# Patient Record
Sex: Female | Born: 1960 | Race: White | Hispanic: No | Marital: Married | Smoking: Former smoker
Health system: Southern US, Community
[De-identification: ages and names within clinical notes are randomized; demographics above are authoritative.]

## PROBLEM LIST (undated history)

## (undated) DIAGNOSIS — K219 Gastro-esophageal reflux disease without esophagitis: Secondary | ICD-10-CM

## (undated) DIAGNOSIS — F99 Mental disorder, not otherwise specified: Secondary | ICD-10-CM

## (undated) DIAGNOSIS — M5126 Other intervertebral disc displacement, lumbar region: Secondary | ICD-10-CM

## (undated) DIAGNOSIS — F32A Depression, unspecified: Secondary | ICD-10-CM

## (undated) DIAGNOSIS — F329 Major depressive disorder, single episode, unspecified: Secondary | ICD-10-CM

## (undated) DIAGNOSIS — I10 Essential (primary) hypertension: Secondary | ICD-10-CM

---

## 2011-12-20 ENCOUNTER — Other Ambulatory Visit: Payer: Self-pay | Admitting: Family Medicine

## 2011-12-20 DIAGNOSIS — Z1231 Encounter for screening mammogram for malignant neoplasm of breast: Secondary | ICD-10-CM

## 2012-01-27 ENCOUNTER — Ambulatory Visit
Admission: RE | Admit: 2012-01-27 | Discharge: 2012-01-27 | Disposition: A | Discharge status: Home / Self Care | Payer: BC Managed Care – PPO | Source: Ambulatory Visit | Attending: Family Medicine | Admitting: Family Medicine

## 2012-01-27 DIAGNOSIS — Z1231 Encounter for screening mammogram for malignant neoplasm of breast: Secondary | ICD-10-CM

## 2012-07-20 ENCOUNTER — Other Ambulatory Visit: Payer: Self-pay | Admitting: Family Medicine

## 2012-07-20 DIAGNOSIS — E041 Nontoxic single thyroid nodule: Secondary | ICD-10-CM

## 2012-07-21 ENCOUNTER — Ambulatory Visit
Admission: RE | Admit: 2012-07-21 | Discharge: 2012-07-21 | Disposition: A | Discharge status: Home / Self Care | Payer: BC Managed Care – PPO | Source: Ambulatory Visit | Attending: Family Medicine | Admitting: Family Medicine

## 2012-07-21 DIAGNOSIS — E041 Nontoxic single thyroid nodule: Secondary | ICD-10-CM

## 2012-07-22 ENCOUNTER — Other Ambulatory Visit: Payer: Self-pay | Admitting: Family Medicine

## 2012-07-22 DIAGNOSIS — E041 Nontoxic single thyroid nodule: Secondary | ICD-10-CM

## 2012-07-28 ENCOUNTER — Other Ambulatory Visit (HOSPITAL_COMMUNITY)
Admission: RE | Admit: 2012-07-28 | Discharge: 2012-07-28 | Disposition: A | Discharge status: Home / Self Care | Payer: BC Managed Care – PPO | Source: Ambulatory Visit | Attending: Diagnostic Radiology | Admitting: Diagnostic Radiology

## 2012-07-28 ENCOUNTER — Ambulatory Visit
Admission: RE | Admit: 2012-07-28 | Discharge: 2012-07-28 | Disposition: A | Discharge status: Home / Self Care | Payer: BC Managed Care – PPO | Source: Ambulatory Visit | Attending: Family Medicine | Admitting: Family Medicine

## 2012-07-28 DIAGNOSIS — E041 Nontoxic single thyroid nodule: Secondary | ICD-10-CM

## 2012-07-28 DIAGNOSIS — E049 Nontoxic goiter, unspecified: Secondary | ICD-10-CM | POA: Insufficient documentation

## 2013-01-11 ENCOUNTER — Other Ambulatory Visit: Payer: Self-pay | Admitting: Obstetrics and Gynecology

## 2013-01-12 ENCOUNTER — Encounter (HOSPITAL_COMMUNITY): Payer: Self-pay | Admitting: Pharmacist

## 2013-01-12 ENCOUNTER — Encounter (HOSPITAL_COMMUNITY): Payer: Self-pay | Admitting: *Deleted

## 2013-01-13 ENCOUNTER — Other Ambulatory Visit: Payer: Self-pay | Admitting: Family Medicine

## 2013-01-13 DIAGNOSIS — Z1231 Encounter for screening mammogram for malignant neoplasm of breast: Secondary | ICD-10-CM

## 2013-01-18 ENCOUNTER — Encounter (HOSPITAL_COMMUNITY)
Admission: RE | Disposition: A | Discharge status: Home / Self Care | Payer: Self-pay | Source: Ambulatory Visit | Attending: Obstetrics and Gynecology

## 2013-01-18 ENCOUNTER — Encounter (HOSPITAL_COMMUNITY): Payer: Self-pay | Admitting: Anesthesiology

## 2013-01-18 ENCOUNTER — Encounter (HOSPITAL_COMMUNITY): Payer: Self-pay | Admitting: *Deleted

## 2013-01-18 ENCOUNTER — Ambulatory Visit (HOSPITAL_COMMUNITY)
Admission: RE | Admit: 2013-01-18 | Discharge: 2013-01-18 | Disposition: A | Discharge status: Home / Self Care | Payer: BC Managed Care – PPO | Source: Ambulatory Visit | Attending: Obstetrics and Gynecology | Admitting: Obstetrics and Gynecology

## 2013-01-18 ENCOUNTER — Ambulatory Visit (HOSPITAL_COMMUNITY): Payer: BC Managed Care – PPO | Admitting: Anesthesiology

## 2013-01-18 DIAGNOSIS — N950 Postmenopausal bleeding: Secondary | ICD-10-CM | POA: Insufficient documentation

## 2013-01-18 DIAGNOSIS — N840 Polyp of corpus uteri: Secondary | ICD-10-CM | POA: Insufficient documentation

## 2013-01-18 HISTORY — DX: Gastro-esophageal reflux disease without esophagitis: K21.9

## 2013-01-18 HISTORY — DX: Mental disorder, not otherwise specified: F99

## 2013-01-18 HISTORY — PX: DILATATION & CURRETTAGE/HYSTEROSCOPY WITH RESECTOCOPE: SHX5572

## 2013-01-18 LAB — CBC
HCT: 37.9 % (ref 36.0–46.0)
Hemoglobin: 12.6 g/dL (ref 12.0–15.0)
MCH: 30.4 pg (ref 26.0–34.0)
MCHC: 33.2 g/dL (ref 30.0–36.0)
MCV: 91.5 fL (ref 78.0–100.0)
Platelets: 223 10*3/uL (ref 150–400)
RBC: 4.14 MIL/uL (ref 3.87–5.11)
RDW: 13 % (ref 11.5–15.5)
WBC: 4.9 10*3/uL (ref 4.0–10.5)

## 2013-01-18 SURGERY — DILATATION & CURETTAGE/HYSTEROSCOPY WITH RESECTOCOPE
Anesthesia: General | Wound class: Clean Contaminated

## 2013-01-18 MED ORDER — GLYCINE 1.5 % IR SOLN
Status: DC | PRN
  Administered 2013-01-18: 3000 mL

## 2013-01-18 MED ORDER — ONDANSETRON HCL 4 MG/2ML IJ SOLN
INTRAMUSCULAR | Status: AC
  Filled 2013-01-18: qty 2

## 2013-01-18 MED ORDER — MIDAZOLAM HCL 5 MG/5ML IJ SOLN
INTRAMUSCULAR | Status: DC | PRN
  Administered 2013-01-18: 2 mg via INTRAVENOUS

## 2013-01-18 MED ORDER — FENTANYL CITRATE 0.05 MG/ML IJ SOLN
INTRAMUSCULAR | Status: AC
  Filled 2013-01-18: qty 2

## 2013-01-18 MED ORDER — LIDOCAINE HCL (CARDIAC) 20 MG/ML IV SOLN
INTRAVENOUS | Status: DC | PRN
  Administered 2013-01-18 (×2): 30 mg via INTRAVENOUS

## 2013-01-18 MED ORDER — KETOROLAC TROMETHAMINE 30 MG/ML IJ SOLN
INTRAMUSCULAR | Status: DC | PRN
  Administered 2013-01-18 (×2): 30 mg via INTRAVENOUS

## 2013-01-18 MED ORDER — PROPOFOL 10 MG/ML IV EMUL
INTRAVENOUS | Status: AC
  Filled 2013-01-18: qty 20

## 2013-01-18 MED ORDER — PROPOFOL 10 MG/ML IV EMUL
INTRAVENOUS | Status: DC | PRN
  Administered 2013-01-18: 180 mg via INTRAVENOUS

## 2013-01-18 MED ORDER — FENTANYL CITRATE 0.05 MG/ML IJ SOLN
INTRAMUSCULAR | Status: DC | PRN
  Administered 2013-01-18 (×2): 50 ug via INTRAVENOUS

## 2013-01-18 MED ORDER — FENTANYL CITRATE 0.05 MG/ML IJ SOLN
25.0000 ug | INTRAMUSCULAR | Status: DC | PRN
  Administered 2013-01-18 (×2): 50 ug via INTRAVENOUS

## 2013-01-18 MED ORDER — KETOROLAC TROMETHAMINE 30 MG/ML IJ SOLN
INTRAMUSCULAR | Status: AC
  Filled 2013-01-18: qty 1

## 2013-01-18 MED ORDER — LACTATED RINGERS IV SOLN
INTRAVENOUS | Status: DC
Start: 2013-01-18 — End: 2013-01-18
  Administered 2013-01-18 (×2): via INTRAVENOUS

## 2013-01-18 MED ORDER — CHLOROPROCAINE HCL 1 % IJ SOLN
INTRAMUSCULAR | Status: AC
  Filled 2013-01-18: qty 30

## 2013-01-18 MED ORDER — MIDAZOLAM HCL 2 MG/2ML IJ SOLN
INTRAMUSCULAR | Status: AC
  Filled 2013-01-18: qty 2

## 2013-01-18 MED ORDER — FLUMAZENIL 1 MG/10ML IV SOLN
INTRAVENOUS | Status: AC
  Filled 2013-01-18: qty 20

## 2013-01-18 MED ORDER — ONDANSETRON HCL 4 MG/2ML IJ SOLN
INTRAMUSCULAR | Status: DC | PRN
  Administered 2013-01-18: 4 mg via INTRAVENOUS

## 2013-01-18 MED ORDER — CHLOROPROCAINE HCL 1 % IJ SOLN
INTRAMUSCULAR | Status: DC | PRN
  Administered 2013-01-18: 20 mL

## 2013-01-18 MED ORDER — FENTANYL CITRATE 0.05 MG/ML IJ SOLN
INTRAMUSCULAR | Status: AC
  Administered 2013-01-18: 50 ug via INTRAVENOUS
  Filled 2013-01-18: qty 2

## 2013-01-18 MED ORDER — LIDOCAINE HCL (CARDIAC) 20 MG/ML IV SOLN
INTRAVENOUS | Status: AC
  Filled 2013-01-18: qty 5

## 2013-01-18 SURGICAL SUPPLY — 18 items
CANISTER SUCTION 2500CC (MISCELLANEOUS) ×2 IMPLANT
CATH ROBINSON RED A/P 16FR (CATHETERS) ×2 IMPLANT
CLOTH BEACON ORANGE TIMEOUT ST (SAFETY) ×2 IMPLANT
CONTAINER PREFILL 10% NBF 60ML (FORM) ×4 IMPLANT
DRESSING TELFA 8X3 (GAUZE/BANDAGES/DRESSINGS) ×2 IMPLANT
ELECT REM PT RETURN 9FT ADLT (ELECTROSURGICAL) ×2
ELECTRODE REM PT RTRN 9FT ADLT (ELECTROSURGICAL) ×1 IMPLANT
ELECTRODE ROLLER VERSAPOINT (ELECTRODE) IMPLANT
ELECTRODE RT ANGLE VERSAPOINT (CUTTING LOOP) IMPLANT
GLOVE BIO SURGEON STRL SZ 6.5 (GLOVE) ×2 IMPLANT
GLOVE BIOGEL PI IND STRL 7.0 (GLOVE) ×2 IMPLANT
GLOVE BIOGEL PI INDICATOR 7.0 (GLOVE) ×2
GOWN STRL REIN XL XLG (GOWN DISPOSABLE) ×4 IMPLANT
LOOP ANGLED CUTTING 22FR (CUTTING LOOP) IMPLANT
PACK HYSTEROSCOPY LF (CUSTOM PROCEDURE TRAY) ×2 IMPLANT
PAD OB MATERNITY 4.3X12.25 (PERSONAL CARE ITEMS) ×2 IMPLANT
TOWEL OR 17X24 6PK STRL BLUE (TOWEL DISPOSABLE) ×4 IMPLANT
WATER STERILE IRR 1000ML POUR (IV SOLUTION) ×2 IMPLANT

## 2013-01-18 NOTE — Anesthesia Preprocedure Evaluation (Signed)

## 2013-01-18 NOTE — Brief Op Note (Signed)
01/18/2013  1:46 PM  PATIENT:  Jarome Lamas  52 y.o. female  PRE-OPERATIVE DIAGNOSIS:  Postmenopausal Bleeding;  Endometrial Mass    POST-OPERATIVE DIAGNOSIS:  Postmenopausal Bleeding;  Endometrial polyp  PROCEDURE:  Diagnostic hysteroscopy, hysteroscopic resection of endometrial polyp, dilation and currettage  SURGEON:  Surgeon(s) and Role:    * Keylee Shrestha A Darice Vicario, MD - Primary  PHYSICIAN ASSISTANT:   ASSISTANTS: none   ANESTHESIA:   general and paracervical block Findings: right lateral endom polyp, nl tubal ostia, thin endometrium nl endocervical canal  EBL:  Total I/O In: 1200 [I.V.:1200] Out: 30 [Urine:30]  BLOOD ADMINISTERED:none  DRAINS: none   LOCAL MEDICATIONS USED:  OTHER 1% nesicaine  SPECIMEN:  Source of Specimen:  EMC w/ endometrial polyp  DISPOSITION OF SPECIMEN:  PATHOLOGY  COUNTS:  YES  TOURNIQUET:  * No tourniquets in log *  DICTATION: .Other Dictation: Dictation Number [id number]  PLAN OF CARE: Discharge to home after PACU  PATIENT DISPOSITION:  PACU - hemodynamically stable.   Delay start of Pharmacological VTE agent (>24hrs) due to surgical blood loss or risk of bleeding: no

## 2013-01-18 NOTE — Transfer of Care (Signed)
Immediate Anesthesia Transfer of Care Note  Patient: Melissa Dudley  Procedure(s) Performed: Procedure(s) (LRB) with comments: DILATATION & CURETTAGE/HYSTEROSCOPY WITH RESECTOCOPE (N/A)  Patient Location: PACU  Anesthesia Type:General  Level of Consciousness: awake, oriented and patient cooperative  Airway & Oxygen Therapy: Patient Spontanous Breathing and Patient connected to nasal cannula oxygen  Post-op Assessment: Report given to PACU RN and Post -op Vital signs reviewed and stable  Post vital signs: Reviewed and stable  Complications: No apparent anesthesia complications

## 2013-01-19 ENCOUNTER — Encounter (HOSPITAL_COMMUNITY): Payer: Self-pay | Admitting: Obstetrics and Gynecology

## 2013-01-19 NOTE — Anesthesia Postprocedure Evaluation (Signed)
  Anesthesia Post-op Note  Patient: Melissa Dudley  Procedure(s) Performed: Procedure(s) (LRB) with comments: DILATATION & CURETTAGE/HYSTEROSCOPY WITH RESECTOCOPE (N/A) Patient is awake and responsive. Pain and nausea are reasonably well controlled. Vital signs are stable and clinically acceptable. Oxygen saturation is clinically acceptable. There are no apparent anesthetic complications at this time. Patient is ready for discharge.

## 2013-01-19 NOTE — Op Note (Signed)
NAMELING, FLESCH NO.:  [id number]  MEDICAL RECORD NO.:  [id number]  LOCATION:  [facility]                          FACILITY:  [facility]  PHYSICIAN:  Maxie Better, M.D.DATE OF BIRTH:  03/18/1961  DATE OF PROCEDURE:  01/18/2013 DATE OF DISCHARGE:  01/18/2013                              OPERATIVE REPORT   PREOPERATIVE DIAGNOSES:  Postmenopausal bleeding, endometrial mass.  PROCEDURES:  Diagnostic hysteroscopy, hysteroscopic resection of endometrial polyp, and dilation and curettage.  POSTOPERATIVE DIAGNOSES:  Postmenopausal bleeding, endometrial polyp.  ANESTHESIA:  General, paracervical block.  SURGEON:  Maxie Better, M.D.  ASSISTANT:  None.  DESCRIPTION OF PROCEDURE:  Under adequate general anesthesia, the patient was placed in the dorsal lithotomy position.  She was sterilely prepped and draped in usual fashion.  Bladder was catheterized for moderate amount of urine.  Examination under anesthesia revealed anteverted uterus.  No adnexal masses could be appreciated.  A bivalve speculum was placed in the vagina.  A 20 mL of 1% Nesacaine was injected paracervically at the 3 and 9 o'clock position.  The anterior lip of the cervix was grasped with a single-tooth tenaculum.  The cervix was serially dilated up to 29 Pratt dilator.  A glycine prime, single loop resectoscope was introduced into the uterine cavity.  The left tubal ostia could be seen.  The right was obscured by the large endometrial polyp arising from the right lateral wall.  Using the resectoscope, the polyp was removed in 2 pieces.  The underlying stalk was also resected. The right tubal ostia could then be seen.  The resectoscope was removed. The cavity was curetted for scant amount of tissue.  The resectoscope was reinserted.  No additional lesions were noted.  The endocervical canal was inspected.  It was without any lesions.  At that point, the procedure was felt to be completed and all  instruments were then removed from the vagina.  Specimen was endometrial curetting with endometrial polyps sent to Pathology.  Estimated blood losswas minimal. Intraoperative fluid was 1200 mL.  Urine output was about 30 mL.  Sponge and instrument counts x2 was correct.  Complications were none.  The patient tolerated the procedure well and was transferred to recovery room in stable condition.     Maxie Better, M.D.     [place]/MEDQ  D:  01/18/2013  T:  01/19/2013  Job:  [id number]

## 2013-02-15 ENCOUNTER — Ambulatory Visit
Admission: RE | Admit: 2013-02-15 | Discharge: 2013-02-15 | Disposition: A | Discharge status: Home / Self Care | Payer: BC Managed Care – PPO | Source: Ambulatory Visit | Attending: Family Medicine | Admitting: Family Medicine

## 2013-02-15 DIAGNOSIS — Z1231 Encounter for screening mammogram for malignant neoplasm of breast: Secondary | ICD-10-CM

## 2013-11-27 ENCOUNTER — Emergency Department (HOSPITAL_COMMUNITY)
Admission: EM | Admit: 2013-11-27 | Discharge: 2013-11-27 | Disposition: A | Discharge status: Home / Self Care | Payer: BC Managed Care – PPO | Source: Home / Self Care | Attending: Family Medicine | Admitting: Family Medicine

## 2013-11-27 ENCOUNTER — Emergency Department (INDEPENDENT_AMBULATORY_CARE_PROVIDER_SITE_OTHER): Payer: BC Managed Care – PPO

## 2013-11-27 ENCOUNTER — Encounter (HOSPITAL_COMMUNITY): Payer: Self-pay | Admitting: Emergency Medicine

## 2013-11-27 DIAGNOSIS — S63509A Unspecified sprain of unspecified wrist, initial encounter: Secondary | ICD-10-CM

## 2013-11-27 DIAGNOSIS — S63502A Unspecified sprain of left wrist, initial encounter: Secondary | ICD-10-CM

## 2013-11-27 NOTE — ED Provider Notes (Signed)
CSN: [id number]     Arrival date & time 11/27/13  1839 History   None    Chief Complaint  Patient presents with  . Wrist Injury   (Consider location/radiation/quality/duration/timing/severity/associated sxs/prior Treatment) Patient is a 52 y.o. female presenting with wrist injury. The history is provided by the patient.  Wrist Injury Location:  Wrist Time since incident:  7 hours Injury: yes   Mechanism of injury: fall   Fall:    Fall occurred: playing basketball with gr cildren and fell backward onto left wrist,, elbow and shoulder intact, distal nvt intact.   Impact surface:  Concrete   Point of impact:  Outstretched arms   Entrapped after fall: no   Wrist location:  L wrist   Past Medical History  Diagnosis Date  . Mental disorder   . GERD (gastroesophageal reflux disease)     stress related   Past Surgical History  Procedure Laterality Date  . Cesarean section    . Dilatation & currettage/hysteroscopy with resectocope  01/18/2013    Procedure: DILATATION & CURETTAGE/HYSTEROSCOPY WITH RESECTOCOPE;  Surgeon: Serita Kyle, MD;  Location: [facility];  Service: Gynecology;  Laterality: N/A;   No family history on file. History  Substance Use Topics  . Smoking status: Former Smoker    Types: Cigarettes    Quit date: 01/12/2006  . Smokeless tobacco: Never Used  . Alcohol Use: No   OB History   Grav Para Term Preterm Abortions TAB SAB Ect Mult Living                 Review of Systems  Constitutional: Negative.   Musculoskeletal: Negative for joint swelling.  Skin: Negative.     Allergies  Review of patient's allergies indicates no known allergies.  Home Medications   Current Outpatient Rx  Name  Route  Sig  Dispense  Refill  . acetaminophen (TYLENOL) 500 MG tablet   Oral   Take 500 mg by mouth daily as needed. For headaches when not taking Ibuprofen         . buPROPion (WELLBUTRIN SR) 150 MG 12 hr tablet   Oral   Take 150-300 mg by mouth 2 (two)  times daily. 300mg  in the am and 150mg  at bedtime         . citalopram (CELEXA) 10 MG tablet   Oral   Take 10 mg by mouth at bedtime.         . clobetasol ointment (TEMOVATE) 0.05 %   Topical   Apply 1 application topically daily as needed. For eczema itching         . ibuprofen (ADVIL,MOTRIN) 600 MG tablet   Oral   Take 600 mg by mouth 3 (three) times daily as needed. For headaches          BP 138/85  Pulse 67  Temp(Src) 98.4 F (36.9 C) (Oral)  Resp 18  SpO2 98%  LMP 01/25/2012 Physical Exam  Nursing note and vitals reviewed. Constitutional: She is oriented to person, place, and time. She appears well-developed and well-nourished.  Musculoskeletal: She exhibits tenderness.       Left shoulder: Normal.       Left elbow: Normal.       Left wrist: She exhibits tenderness. She exhibits normal range of motion, no bony tenderness, no swelling, no crepitus and no deformity.       Left hand: Normal.  Neurological: She is alert and oriented to person, place, and time.  Skin: Skin is  warm and dry.    ED Course  Procedures (including critical care time) Labs Review Labs Reviewed - No data to display Imaging Review Dg Wrist Complete Left  11/27/2013   CLINICAL DATA:  Fall.  Wrist injury.  Radial sided wrist pain.  EXAM: LEFT WRIST - COMPLETE 3+ VIEW  COMPARISON:  None.  FINDINGS: There is no evidence of fracture or dislocation. There is no evidence of arthropathy or other focal bone abnormality. Soft tissues are unremarkable.  IMPRESSION: Negative.   Electronically Signed   By: Myles Rosenthal M.D.   On: 11/27/2013 19:35    EKG Interpretation    Date/Time:    Ventricular Rate:    PR Interval:    QRS Duration:   QT Interval:    QTC Calculation:   R Axis:     Text Interpretation:              MDM  X-rays reviewed and report per radiologist.     Linna Hoff, MD 11/27/13 [phone]

## 2013-11-27 NOTE — ED Notes (Signed)
Pt c/o left wrist inj onset 1200 today while playing basketball... Fell on concrete backwards Sxs also include: tingly of hands She is alert w/no signs of acute distress.

## 2014-01-17 ENCOUNTER — Other Ambulatory Visit: Payer: Self-pay

## 2014-01-17 DIAGNOSIS — Z1231 Encounter for screening mammogram for malignant neoplasm of breast: Secondary | ICD-10-CM

## 2014-03-16 ENCOUNTER — Ambulatory Visit
Admission: RE | Admit: 2014-03-16 | Discharge: 2014-03-16 | Disposition: A | Discharge status: Home / Self Care | Payer: BC Managed Care – PPO | Source: Ambulatory Visit

## 2014-03-16 DIAGNOSIS — Z1231 Encounter for screening mammogram for malignant neoplasm of breast: Secondary | ICD-10-CM

## 2015-02-23 ENCOUNTER — Other Ambulatory Visit: Payer: Self-pay

## 2015-02-23 DIAGNOSIS — Z1231 Encounter for screening mammogram for malignant neoplasm of breast: Secondary | ICD-10-CM

## 2015-03-24 ENCOUNTER — Ambulatory Visit
Admission: RE | Admit: 2015-03-24 | Discharge: 2015-03-24 | Disposition: A | Discharge status: Home / Self Care | Payer: BC Managed Care – PPO | Source: Ambulatory Visit

## 2015-03-24 DIAGNOSIS — Z1231 Encounter for screening mammogram for malignant neoplasm of breast: Secondary | ICD-10-CM

## 2016-03-18 ENCOUNTER — Other Ambulatory Visit: Payer: Self-pay

## 2016-03-18 DIAGNOSIS — Z1231 Encounter for screening mammogram for malignant neoplasm of breast: Secondary | ICD-10-CM

## 2016-04-22 ENCOUNTER — Ambulatory Visit
Admission: RE | Admit: 2016-04-22 | Discharge: 2016-04-22 | Disposition: A | Discharge status: Home / Self Care | Payer: BC Managed Care – PPO | Source: Ambulatory Visit

## 2016-04-22 DIAGNOSIS — Z1231 Encounter for screening mammogram for malignant neoplasm of breast: Secondary | ICD-10-CM

## 2016-08-07 ENCOUNTER — Encounter (HOSPITAL_COMMUNITY): Payer: Self-pay | Admitting: Emergency Medicine

## 2016-08-07 ENCOUNTER — Emergency Department (HOSPITAL_COMMUNITY)
Admission: EM | Admit: 2016-08-07 | Discharge: 2016-08-07 | Disposition: A | Discharge status: Home / Self Care | Payer: No Typology Code available for payment source | Attending: Emergency Medicine | Admitting: Emergency Medicine

## 2016-08-07 ENCOUNTER — Emergency Department (HOSPITAL_COMMUNITY): Payer: No Typology Code available for payment source

## 2016-08-07 DIAGNOSIS — Z87891 Personal history of nicotine dependence: Secondary | ICD-10-CM | POA: Insufficient documentation

## 2016-08-07 DIAGNOSIS — Y92410 Unspecified street and highway as the place of occurrence of the external cause: Secondary | ICD-10-CM | POA: Insufficient documentation

## 2016-08-07 DIAGNOSIS — Y999 Unspecified external cause status: Secondary | ICD-10-CM | POA: Insufficient documentation

## 2016-08-07 DIAGNOSIS — M549 Dorsalgia, unspecified: Secondary | ICD-10-CM | POA: Insufficient documentation

## 2016-08-07 DIAGNOSIS — S0990XA Unspecified injury of head, initial encounter: Secondary | ICD-10-CM | POA: Diagnosis present

## 2016-08-07 DIAGNOSIS — S161XXA Strain of muscle, fascia and tendon at neck level, initial encounter: Secondary | ICD-10-CM | POA: Insufficient documentation

## 2016-08-07 DIAGNOSIS — Z79899 Other long term (current) drug therapy: Secondary | ICD-10-CM | POA: Insufficient documentation

## 2016-08-07 DIAGNOSIS — Y9389 Activity, other specified: Secondary | ICD-10-CM | POA: Diagnosis not present

## 2016-08-07 DIAGNOSIS — Z791 Long term (current) use of non-steroidal anti-inflammatories (NSAID): Secondary | ICD-10-CM | POA: Diagnosis not present

## 2016-08-07 MED ORDER — CYCLOBENZAPRINE HCL 5 MG PO TABS: 5.0000 mg | ORAL_TABLET | Freq: Two times a day (BID) | ORAL | 0 refills | Status: DC | PRN
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
## 2017-03-24 ENCOUNTER — Other Ambulatory Visit: Payer: Self-pay | Admitting: Plastic Surgery

## 2017-03-24 DIAGNOSIS — N62 Hypertrophy of breast: Secondary | ICD-10-CM

## 2017-03-27 ENCOUNTER — Other Ambulatory Visit: Payer: Self-pay | Admitting: Plastic Surgery

## 2017-03-27 DIAGNOSIS — N62 Hypertrophy of breast: Secondary | ICD-10-CM

## 2017-03-27 DIAGNOSIS — Z01818 Encounter for other preprocedural examination: Secondary | ICD-10-CM

## 2017-03-28 ENCOUNTER — Ambulatory Visit
Admission: RE | Admit: 2017-03-28 | Discharge: 2017-03-28 | Disposition: A | Discharge status: Home / Self Care | Payer: BC Managed Care – PPO | Source: Ambulatory Visit | Attending: Plastic Surgery | Admitting: Plastic Surgery

## 2017-03-28 DIAGNOSIS — N62 Hypertrophy of breast: Secondary | ICD-10-CM

## 2017-03-28 DIAGNOSIS — Z01818 Encounter for other preprocedural examination: Secondary | ICD-10-CM

## 2017-04-15 ENCOUNTER — Other Ambulatory Visit: Payer: Self-pay | Admitting: Plastic Surgery

## 2017-04-15 HISTORY — PX: REDUCTION MAMMAPLASTY: SUR839

## 2018-05-15 ENCOUNTER — Other Ambulatory Visit: Payer: Self-pay | Admitting: Plastic Surgery

## 2018-05-15 DIAGNOSIS — Z1231 Encounter for screening mammogram for malignant neoplasm of breast: Secondary | ICD-10-CM

## 2018-07-21 ENCOUNTER — Ambulatory Visit
Admission: RE | Admit: 2018-07-21 | Discharge: 2018-07-21 | Disposition: A | Discharge status: Home / Self Care | Payer: BC Managed Care – PPO | Source: Ambulatory Visit | Attending: Plastic Surgery | Admitting: Plastic Surgery

## 2018-07-21 DIAGNOSIS — Z1231 Encounter for screening mammogram for malignant neoplasm of breast: Secondary | ICD-10-CM

## 2018-07-24 ENCOUNTER — Ambulatory Visit: Payer: BC Managed Care – PPO

## 2018-09-15 ENCOUNTER — Emergency Department (HOSPITAL_COMMUNITY): Payer: BC Managed Care – PPO

## 2018-09-15 ENCOUNTER — Emergency Department (HOSPITAL_COMMUNITY)
Admission: EM | Admit: 2018-09-15 | Discharge: 2018-09-15 | Disposition: A | Discharge status: Home / Self Care | Payer: BC Managed Care – PPO | Attending: Emergency Medicine | Admitting: Emergency Medicine

## 2018-09-15 ENCOUNTER — Encounter (HOSPITAL_COMMUNITY): Payer: Self-pay | Admitting: Emergency Medicine

## 2018-09-15 ENCOUNTER — Other Ambulatory Visit: Payer: Self-pay

## 2018-09-15 DIAGNOSIS — Z87891 Personal history of nicotine dependence: Secondary | ICD-10-CM | POA: Insufficient documentation

## 2018-09-15 DIAGNOSIS — I10 Essential (primary) hypertension: Secondary | ICD-10-CM | POA: Insufficient documentation

## 2018-09-15 DIAGNOSIS — Z79899 Other long term (current) drug therapy: Secondary | ICD-10-CM | POA: Diagnosis not present

## 2018-09-15 DIAGNOSIS — R002 Palpitations: Secondary | ICD-10-CM | POA: Diagnosis not present

## 2018-09-15 DIAGNOSIS — R42 Dizziness and giddiness: Secondary | ICD-10-CM | POA: Diagnosis present

## 2018-09-15 HISTORY — DX: Major depressive disorder, single episode, unspecified: F32.9

## 2018-09-15 HISTORY — DX: Other intervertebral disc displacement, lumbar region: M51.26

## 2018-09-15 HISTORY — DX: Essential (primary) hypertension: I10

## 2018-09-15 HISTORY — DX: Depression, unspecified: F32.A

## 2018-09-15 LAB — BASIC METABOLIC PANEL
Anion gap: 8 (ref 5–15)
BUN: 11 mg/dL (ref 6–20)
CO2: 27 mmol/L (ref 22–32)
Calcium: 9.3 mg/dL (ref 8.9–10.3)
Chloride: 105 mmol/L (ref 98–111)
Creatinine, Ser: 0.83 mg/dL (ref 0.44–1.00)
GFR calc Af Amer: >60 mL/min (ref >60)
GFR calc non Af Amer: >60 mL/min (ref >60)
Glucose, Bld: 102 mg/dL — ABNORMAL HIGH (ref 70–99)
Potassium: 4.2 mmol/L (ref 3.5–5.1)
Sodium: 140 mmol/L (ref 135–145)

## 2018-09-15 LAB — CBC
HCT: 37.8 % (ref 36.0–46.0)
Hemoglobin: 12.2 g/dL (ref 12.0–15.0)
MCH: 31.5 pg (ref 26.0–34.0)
MCHC: 32.3 g/dL (ref 30.0–36.0)
MCV: 97.7 fL (ref 78.0–100.0)
Platelets: 215 10*3/uL (ref 150–400)
RBC: 3.87 MIL/uL (ref 3.87–5.11)
RDW: 12.3 % (ref 11.5–15.5)
WBC: 3.7 10*3/uL — ABNORMAL LOW (ref 4.0–10.5)

## 2018-09-15 LAB — I-STAT TROPONIN, ED: Troponin i, poc: 0 ng/mL (ref 0.00–0.08)

## 2018-09-15 MED ORDER — LISINOPRIL 10 MG PO TABS: 10.0000 mg | ORAL_TABLET | Freq: Every day | ORAL | 1 refills | Status: DC

## 2018-09-15 NOTE — ED Notes (Signed)
Pt returned from CT °

## 2018-09-15 NOTE — ED Triage Notes (Signed)
Patient reports feeling very lethargic for a few days today she woke up with blurry vision and felt that her heart was fluttering. Pt states she has been checking her BP at home and it has been constantly elevated.

## 2018-09-15 NOTE — Discharge Instructions (Addendum)
Follow up with Dr. Andrey Campanile for evaluation of blood pressure.  I will restart your lisinopril.  Return if any problems.

## 2018-09-15 NOTE — ED Notes (Signed)
Patient verbalizes understanding of discharge instructions. Opportunity for questioning and answers were provided. Ambulatory at discharge in NAD.  

## 2018-09-15 NOTE — ED Provider Notes (Signed)
[facility] EMERGENCY DEPARTMENT Provider Note   CSN: [id number] Arrival date & time: 09/15/18  1038     History   Chief Complaint Chief Complaint  Patient presents with  . Hypertension  . Dizziness    HPI Melissa Dudley is a 57 y.o. female.  The history is provided by the patient. No language interpreter was used.  Hypertension  This is a new problem. The current episode started more than 1 week ago. The problem occurs constantly. The problem has been gradually worsening. Associated symptoms comments: palpatations. Nothing aggravates the symptoms. Nothing relieves the symptoms. She has tried nothing for the symptoms. The treatment provided no relief.  Dizziness  Pt reports she has been having episodes of blurred vision on and off for over 2 months.  Pt reports episodes come and go.  Pt reports she has been checking her blood pressure and her blood pressure has been elevated.  Pt reports she has been on blood pressure medications in the past.  Pt reports she lost weight and Dr. Andrey Campanile stopped her BP medication.    Past Medical History:  Diagnosis Date  . Depression   . GERD (gastroesophageal reflux disease)    stress related  . Hypertension   . Lumbar herniated disc   . Mental disorder     There are no active problems to display for this patient.   Past Surgical History:  Procedure Laterality Date  . CESAREAN SECTION    . DILATATION & CURRETTAGE/HYSTEROSCOPY WITH RESECTOCOPE  01/18/2013   Procedure: DILATATION & CURETTAGE/HYSTEROSCOPY WITH RESECTOCOPE;  Surgeon: Serita Kyle, MD;  Location: [facility];  Service: Gynecology;  Laterality: N/A;  . REDUCTION MAMMAPLASTY Bilateral 04/15/2017     OB History   None      Home Medications    Prior to Admission medications   Medication Sig Start Date End Date Taking? Authorizing Provider  acetaminophen (TYLENOL) 500 MG tablet Take 500 mg by mouth daily as needed. For headaches when not taking  Ibuprofen   Yes [provider]  buPROPion (WELLBUTRIN SR) 150 MG 12 hr tablet Take 150 mg by mouth daily.    Yes [provider]  citalopram (CELEXA) 10 MG tablet Take 10 mg by mouth daily.    Yes [provider]  clobetasol ointment (TEMOVATE) 0.05 % Apply 1 application topically daily as needed. For eczema itching   Yes [provider]  fexofenadine (ALLEGRA) 180 MG tablet Take 180 mg by mouth daily as needed for allergies.    Yes [provider]  fluticasone (FLONASE) 50 MCG/ACT nasal spray Place 1-2 sprays into the nose daily as needed for allergies. 04/01/16  Yes [provider]  ibuprofen (ADVIL,MOTRIN) 600 MG tablet Take 600 mg by mouth 3 (three) times daily as needed. For headaches   Yes [provider]  Melatonin 10 MG TABS Take 10 mg by mouth at bedtime.   Yes [provider]  cyclobenzaprine (FLEXERIL) 5 MG tablet Take 1 tablet (5 mg total) by mouth 2 (two) times daily as needed for muscle spasms. Patient not taking: Reported on 09/15/2018 08/07/16   Benjiman Core, MD    Family History No family history on file.  Social History Social History   Tobacco Use  . Smoking status: Former Smoker    Types: Cigarettes    Last attempt to quit: 01/12/2006    Years since quitting: 12.6  . Smokeless tobacco: Never Used  Substance Use Topics  . Alcohol use:  No  . Drug use: No     Allergies   Bee venom and Hydrocodone   Review of Systems Review of Systems  Eyes: Positive for visual disturbance.  Neurological: Positive for dizziness.  All other systems reviewed and are negative.    Physical Exam Updated Vital Signs BP (!) 149/96   Pulse (!) 55   Temp 98.6 F (37 C) (Oral)   Resp 15   Ht 5\' 4"  (1.626 m)   Wt 73.9 kg   LMP 01/25/2012   SpO2 100%   BMI 27.98 kg/m   Physical Exam  Constitutional: She appears well-developed and well-nourished.  HENT:  Head: Normocephalic.  Right Ear: External  ear normal.  Left Ear: External ear normal.  Nose: Nose normal.  Mouth/Throat: Oropharynx is clear and moist.  Eyes: Pupils are equal, round, and reactive to light. Conjunctivae and EOM are normal.  Neck: Normal range of motion. Neck supple.  Cardiovascular: Normal rate.  Pulmonary/Chest: Effort normal.  Abdominal: Soft.  Musculoskeletal: Normal range of motion.  Neurological: She is alert.  Skin: Skin is warm.  Psychiatric: She has a normal mood and affect.  Nursing note and vitals reviewed.    ED Treatments / Results  Labs (all labs ordered are listed, but only abnormal results are displayed) Labs Reviewed  BASIC METABOLIC PANEL - Abnormal; Notable for the following components:      Result Value   Glucose, Bld 102 (*)    All other components within normal limits  CBC - Abnormal; Notable for the following components:   WBC 3.7 (*)    All other components within normal limits  URINALYSIS, ROUTINE W REFLEX MICROSCOPIC  I-STAT TROPONIN, ED  CBG MONITORING, ED    EKG EKG Interpretation  Date/Time:  Tuesday September 15 2018 11:15:23 EDT Ventricular Rate:  61 PR Interval:  168 QRS Duration: 92 QT Interval:  418 QTC Calculation: 420 R Axis:   51 Text Interpretation:  Normal sinus rhythm Normal ECG Confirmed by Virgina Norfolk [phone]) on 09/15/2018 4:34:55 PM   Radiology Ct Head Wo Contrast  Result Date: 09/15/2018 CLINICAL DATA:  Lethargic, elevated BP EXAM: CT HEAD WITHOUT CONTRAST TECHNIQUE: Contiguous axial images were obtained from the base of the skull through the vertex without intravenous contrast. COMPARISON:  CT 08/07/2016 FINDINGS: Brain: No evidence of acute infarction, hemorrhage, hydrocephalus, extra-axial collection or mass lesion/mass effect. Vascular: No hyperdense vessel or unexpected calcification. Skull: Normal. Negative for fracture or focal lesion. Sinuses/Orbits: No acute finding. Other: None IMPRESSION: Negative non contrasted CT appearance of the brain.  Electronically Signed   By: Jasmine Pang M.D.   On: 09/15/2018 15:32    Procedures Procedures (including critical care time)  Medications Ordered in ED Medications - No data to display   Initial Impression / Assessment and Plan / ED Course  I have reviewed the triage vital signs and the nursing notes.  Pertinent labs & imaging results that were available during my care of the patient were reviewed by me and considered in my medical decision making (see chart for details).     MDM  Ct head is normal.   EKg  Normal sinus normal EKG.   Troponin negative.   Pt given rx for lisinopril which she was on in the past    Final Clinical Impressions(s) / ED Diagnoses   Final diagnoses:  Hypertension, unspecified type  Palpitations    ED Discharge Orders         Ordered    lisinopril (  PRINIVIL,ZESTRIL) 10 MG tablet  Daily     09/15/18 1638        An After Visit Summary was printed and given to the patient.    Elson Areas, PA-C 09/15/18 1639    Virgina Norfolk, DO 09/15/18 2331

## 2019-10-21 ENCOUNTER — Other Ambulatory Visit: Payer: Self-pay | Admitting: Family Medicine

## 2019-10-21 DIAGNOSIS — Z1231 Encounter for screening mammogram for malignant neoplasm of breast: Secondary | ICD-10-CM

## 2019-12-14 ENCOUNTER — Ambulatory Visit
Admission: RE | Admit: 2019-12-14 | Discharge: 2019-12-14 | Disposition: A | Discharge status: Home / Self Care | Payer: BC Managed Care – PPO | Source: Ambulatory Visit | Attending: Family Medicine | Admitting: Family Medicine

## 2019-12-14 ENCOUNTER — Other Ambulatory Visit: Payer: Self-pay

## 2019-12-14 DIAGNOSIS — Z1231 Encounter for screening mammogram for malignant neoplasm of breast: Secondary | ICD-10-CM

## 2020-11-30 ENCOUNTER — Other Ambulatory Visit: Payer: Self-pay | Admitting: Family Medicine

## 2020-11-30 DIAGNOSIS — Z1231 Encounter for screening mammogram for malignant neoplasm of breast: Secondary | ICD-10-CM

## 2021-01-12 ENCOUNTER — Other Ambulatory Visit: Payer: Self-pay

## 2021-01-12 ENCOUNTER — Ambulatory Visit
Admission: RE | Admit: 2021-01-12 | Discharge: 2021-01-12 | Disposition: A | Discharge status: Home / Self Care | Payer: BC Managed Care – PPO | Source: Ambulatory Visit | Attending: Family Medicine | Admitting: Family Medicine

## 2021-01-12 DIAGNOSIS — Z1231 Encounter for screening mammogram for malignant neoplasm of breast: Secondary | ICD-10-CM

## 2021-12-14 ENCOUNTER — Encounter (HOSPITAL_COMMUNITY): Payer: Self-pay | Admitting: Emergency Medicine

## 2021-12-14 ENCOUNTER — Emergency Department (HOSPITAL_COMMUNITY)
Admission: EM | Admit: 2021-12-14 | Discharge: 2021-12-14 | Disposition: A | Discharge status: Home / Self Care | Payer: BC Managed Care – PPO | Attending: Emergency Medicine | Admitting: Emergency Medicine

## 2021-12-14 ENCOUNTER — Other Ambulatory Visit: Payer: Self-pay

## 2021-12-14 ENCOUNTER — Emergency Department (HOSPITAL_COMMUNITY): Payer: BC Managed Care – PPO

## 2021-12-14 DIAGNOSIS — R202 Paresthesia of skin: Secondary | ICD-10-CM | POA: Insufficient documentation

## 2021-12-14 DIAGNOSIS — M5416 Radiculopathy, lumbar region: Secondary | ICD-10-CM | POA: Insufficient documentation

## 2021-12-14 DIAGNOSIS — S0502XA Injury of conjunctiva and corneal abrasion without foreign body, left eye, initial encounter: Secondary | ICD-10-CM | POA: Diagnosis not present

## 2021-12-14 DIAGNOSIS — X58XXXA Exposure to other specified factors, initial encounter: Secondary | ICD-10-CM | POA: Diagnosis not present

## 2021-12-14 DIAGNOSIS — Z79899 Other long term (current) drug therapy: Secondary | ICD-10-CM | POA: Diagnosis not present

## 2021-12-14 DIAGNOSIS — I10 Essential (primary) hypertension: Secondary | ICD-10-CM | POA: Diagnosis not present

## 2021-12-14 DIAGNOSIS — Z87891 Personal history of nicotine dependence: Secondary | ICD-10-CM | POA: Insufficient documentation

## 2021-12-14 DIAGNOSIS — S0592XA Unspecified injury of left eye and orbit, initial encounter: Secondary | ICD-10-CM | POA: Diagnosis present

## 2021-12-14 MED ORDER — LORAZEPAM 0.5 MG PO TABS
0.5000 mg | ORAL_TABLET | Freq: Once | ORAL | Status: AC
  Administered 2021-12-14: 12:00:00 0.5 mg via ORAL
  Filled 2021-12-14: qty 1

## 2021-12-14 MED ORDER — HYDROCODONE-ACETAMINOPHEN 5-325 MG PO TABS: 1.0000 | ORAL_TABLET | Freq: Four times a day (QID) | ORAL | 0 refills | Status: DC | PRN

## 2021-12-14 MED ORDER — FLUORESCEIN SODIUM 1 MG OP STRP
1.0000 | ORAL_STRIP | Freq: Once | OPHTHALMIC | Status: DC
  Filled 2021-12-14: qty 1

## 2021-12-14 MED ORDER — DIPHENHYDRAMINE HCL 25 MG PO CAPS
25.0000 mg | ORAL_CAPSULE | Freq: Once | ORAL | Status: AC
  Administered 2021-12-14: 12:00:00 25 mg via ORAL
  Filled 2021-12-14: qty 1

## 2021-12-14 MED ORDER — GADOBUTROL 1 MMOL/ML IV SOLN
7.7000 mL | Freq: Once | INTRAVENOUS | Status: AC | PRN
  Administered 2021-12-14: 14:00:00 7.7 mL via INTRAVENOUS

## 2021-12-14 MED ORDER — TETRACAINE HCL 0.5 % OP SOLN
2.0000 [drp] | Freq: Once | OPHTHALMIC | Status: DC
  Filled 2021-12-14: qty 4

## 2021-12-14 MED ORDER — HYDROCODONE-ACETAMINOPHEN 5-325 MG PO TABS
1.0000 | ORAL_TABLET | Freq: Once | ORAL | Status: AC
  Administered 2021-12-14: 12:00:00 1 via ORAL
  Filled 2021-12-14: qty 1

## 2021-12-14 MED ORDER — HYDROCODONE-ACETAMINOPHEN 5-325 MG PO TABS
1.0000 | ORAL_TABLET | Freq: Once | ORAL | Status: AC
  Administered 2021-12-14: 16:00:00 1 via ORAL
  Filled 2021-12-14: qty 1

## 2021-12-14 NOTE — ED Notes (Signed)
Called pt to recheck vitals. No response.  

## 2021-12-14 NOTE — ED Triage Notes (Signed)
Pt reports hx of hernia disks worsening over the last week. Pt seen last night at [facility] given pain medications IM, started steroid prescription today. Denies recent fevers, bowel bladder incontinence. Pt unable to walk due to pain 10/10.

## 2021-12-14 NOTE — ED Notes (Signed)
Pt walked w/assistx1 to the bathroom

## 2021-12-14 NOTE — ED Notes (Signed)
Pt states she gets numbness and tingling in her lower back that shoots into her hips and down her left leg. Pt denies loss of bowel and bladder.

## 2021-12-14 NOTE — ED Notes (Signed)
Pt in MRI.

## 2021-12-14 NOTE — ED Provider Notes (Signed)
[facility] EMERGENCY DEPARTMENT Provider Note   CSN: [id number] Arrival date & time: 12/14/21  1105     History Chief Complaint  Patient presents with   Back Pain    Melissa Dudley is a 60 y.o. female who presents with back pain.  The history is provided by the patient.  Back Pain Location:  Lumbar spine Quality:  Shooting and stabbing Radiates to:  L thigh Pain severity:  Severe Pain is:  Same all the time Onset quality:  Sudden Duration:  2 days Timing:  Constant Progression:  Unchanged Chronicity: acute on chronic. Context: not falling, not jumping from heights, not lifting heavy objects, not MCA, not MVA, not occupational injury, not physical stress, not recent injury and not twisting   Relieved by:  Narcotics Ineffective treatments:  Ibuprofen, NSAIDs, muscle relaxants, being still and bed rest Associated symptoms: numbness (L foot) and tingling (L foot)   Associated symptoms: no abdominal pain, no bladder incontinence, no chest pain, no dysuria, no fever, no headaches and no weakness       Past Medical History:  Diagnosis Date   Depression    GERD (gastroesophageal reflux disease)    stress related   Hypertension    Lumbar herniated disc    Mental disorder     There are no problems to display for this patient.   Past Surgical History:  Procedure Laterality Date   CESAREAN SECTION     DILATATION & CURRETTAGE/HYSTEROSCOPY WITH RESECTOCOPE  01/18/2013   Procedure: DILATATION & CURETTAGE/HYSTEROSCOPY WITH RESECTOCOPE;  Surgeon: Serita Kyle, MD;  Location: [facility];  Service: Gynecology;  Laterality: N/A;   REDUCTION MAMMAPLASTY Bilateral 04/15/2017     OB History   No obstetric history on file.     History reviewed. No pertinent family history.  Social History   Tobacco Use   Smoking status: Former    Types: Cigarettes    Quit date: 01/12/2006    Years since quitting: 15.9   Smokeless tobacco: Never  Substance Use  Topics   Alcohol use: No   Drug use: No    Home Medications Prior to Admission medications   Medication Sig Start Date End Date Taking? Authorizing Provider  buPROPion (WELLBUTRIN XL) 300 MG 24 hr tablet Take 300 mg by mouth in the morning.   Yes [provider]  citalopram (CELEXA) 40 MG tablet Take 40 mg by mouth in the morning.   Yes [provider]  fluticasone (FLONASE) 50 MCG/ACT nasal spray Place 1-2 sprays into both nostrils daily as needed for allergies or rhinitis. 04/01/16  Yes [provider]  HYDROcodone-acetaminophen (NORCO) 5-325 MG tablet Take 1 tablet by mouth every 6 (six) hours as needed for moderate pain. 12/14/21  Yes Keith Cancio, Davy Pique, MD  ibuprofen (ADVIL,MOTRIN) 600 MG tablet Take 600 mg by mouth 3 (three) times daily as needed for headache or mild pain.   Yes [provider]  lisinopril (PRINIVIL,ZESTRIL) 10 MG tablet Take 1 tablet (10 mg total) by mouth daily. 09/15/18  Yes Cheron Schaumann K, PA-C  predniSONE (DELTASONE) 10 MG tablet Take 20-60 mg by mouth See admin instructions. Take 60 mg by mouth once daily on days 1-4, 40 mg once daily on days 5-8, and 20 mg once daily on days 9-12   Yes [provider]  trimethoprim-polymyxin b (POLYTRIM) ophthalmic solution Place 1 drop into the left eye 4 (four) times daily. 12/13/21  Yes [provider]  clobetasol ointment (TEMOVATE) 0.05 %  Apply 1 application topically daily as needed. For eczema itching    [provider]  cyclobenzaprine (FLEXERIL) 5 MG tablet Take 1 tablet (5 mg total) by mouth 2 (two) times daily as needed for muscle spasms. Patient not taking: Reported on 12/14/2021 08/07/16   Davonna Belling, MD  fexofenadine (ALLEGRA) 180 MG tablet Take 180 mg by mouth daily as needed for allergies.     [provider]  Melatonin 10 MG TABS Take 10 mg by mouth at bedtime.    [provider]    Allergies    Bee venom and Oxycodone  hcl  Review of Systems   Review of Systems  Constitutional:  Negative for chills and fever.  HENT:  Negative for ear pain and sore throat.   Eyes:  Positive for redness and visual disturbance (blurry vision).  Respiratory:  Negative for cough and shortness of breath.   Cardiovascular:  Negative for chest pain and palpitations.  Gastrointestinal:  Negative for abdominal pain and vomiting.  Genitourinary:  Negative for bladder incontinence, dysuria and hematuria.  Musculoskeletal:  Positive for back pain. Negative for arthralgias.  Skin:  Negative for color change and rash.  Neurological:  Positive for tingling (L foot) and numbness (L foot). Negative for seizures, syncope, weakness and headaches.  All other systems reviewed and are negative.  Physical Exam Updated Vital Signs BP (!) 154/95    Pulse 66    Temp 98.8 F (37.1 C) (Oral)    Resp 18    Ht 5\' 4"  (1.626 m)    Wt 77.1 kg    LMP 01/25/2012    SpO2 96%    BMI 29.18 kg/m   Physical Exam Vitals and nursing note reviewed.  Constitutional:      General: She is not in acute distress.    Appearance: She is well-developed.  HENT:     Head: Normocephalic and atraumatic.     Right Ear: External ear normal.     Left Ear: External ear normal.     Nose: Nose normal.     Mouth/Throat:     Mouth: Mucous membranes are moist.     Pharynx: Oropharynx is clear.  Eyes:     Extraocular Movements: Extraocular movements intact.     Conjunctiva/sclera:     Left eye: Left conjunctiva is injected.     Pupils: Pupils are equal, round, and reactive to light.     Left eye: Corneal abrasion and fluorescein uptake present.   Cardiovascular:     Rate and Rhythm: Normal rate and regular rhythm.     Pulses: Normal pulses.     Heart sounds: Normal heart sounds. No murmur heard. Pulmonary:     Effort: Pulmonary effort is normal. No respiratory distress.     Breath sounds: Normal breath sounds.  Abdominal:     General: There is no distension.      Palpations: Abdomen is soft.     Tenderness: There is no abdominal tenderness. There is no guarding or rebound.  Musculoskeletal:        General: No swelling.     Cervical back: Neck supple.  Skin:    General: Skin is warm and dry.     Capillary Refill: Capillary refill takes less than 2 seconds.  Neurological:     General: No focal deficit present.     Mental Status: She is alert and oriented to person, place, and time.     Motor: No weakness.     Comments:  Intermittent paresthesias in L foot  Psychiatric:        Mood and Affect: Mood normal.    ED Results / Procedures / Treatments   Labs (all labs ordered are listed, but only abnormal results are displayed) Labs Reviewed - No data to display  EKG None  Radiology MR Lumbar Spine W Wo Contrast  Result Date: 12/14/2021 CLINICAL DATA:  Persistent low back pain EXAM: MRI LUMBAR SPINE WITHOUT AND WITH CONTRAST TECHNIQUE: Multiplanar and multiecho pulse sequences of the lumbar spine were obtained without and with intravenous contrast. CONTRAST:  7.71mL GADAVIST GADOBUTROL 1 MMOL/ML IV SOLN COMPARISON:  Lumbar spine MRI 05/29/2020 FINDINGS: Segmentation: Standard; the lowest formed disc space is designated L5-S1 Alignment:  Normal. 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 Vertebral body heights are preserved. There is no suspicious marrow signal abnormality. There is no abnormal marrow enhancement. Conus medullaris and cauda equina: Conus extends to the mid L1 level. Conus and cauda equina appear normal. Paraspinal and other soft tissues: Unremarkable. Disc levels: There is disc desiccation and narrowing at L5-S1. The other disc spaces are overall preserved. There is mild facet arthropathy at L3-L4 through L5-S1. T12-L1: No significant spinal canal or neural foraminal stenosis L1-L2: No significant spinal canal or neural foraminal stenosis. L2-L3: There  is a mild disc bulge. A T2 intermediate signal structure is noted within the left subarticular zone extending to the neural foramen which was not present on the prior study and may reflect extruded or sequestered disc material. A left foraminal annular fissure is also noted, new since 2021. This results in narrowing of the left subarticular zone with possible displacement of the traversing left L3 nerve root and severe left neural foraminal stenosis with possible impingement of the exiting L2 nerve root. There is no significant spinal canal or right neural foraminal stenosis. L3-L4: There is a minimal disc bulge with a left foraminal annular fissure and mild bilateral facet arthropathy resulting in mild left worse than right neural foraminal stenosis without significant spinal canal stenosis, not significantly changed L4-L5: There is a mild disc bulge with a left foraminal/extraforaminal annular fissure and mild bilateral facet arthropathy resulting in mild-to-moderate right worse than left neural foraminal stenosis without significant spinal canal stenosis, not significantly changed. L5-S1: There is a mild disc bulge with a left foraminal component and bilateral facet arthropathy resulting in severe left and moderate right neural foraminal stenosis without significant spinal canal stenosis, not significantly changed. IMPRESSION: 1. Suspected left subarticular zone/foraminal disc extrusion or sequestration at L2-L3 which may displace the traversing left L3 nerve root and/or impinge the exiting left L2 nerve root, new since the prior study from 2021. Correlate with any radicular symptoms. 2. 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 3. Additional multilevel degenerative changes detailed above without other high-grade spinal canal or neural foraminal stenosis are overall not significantly changed. 4. Mild facet arthropathy at L3-L4 through L5-S1. Electronically Signed   By: Valetta Mole M.D.   On: 12/14/2021 14:22    Procedures Procedures   Medications Ordered in ED Medications  HYDROcodone-acetaminophen (NORCO/VICODIN) 5-325 MG per tablet 1 tablet (1 tablet Oral Given 12/14/21 1152)  diphenhydrAMINE (BENADRYL) capsule 25 mg (25 mg Oral Given 12/14/21 1152)  LORazepam (ATIVAN) tablet 0.5 mg (0.5 mg Oral Given 12/14/21 1152)  gadobutrol (GADAVIST) 1 MMOL/ML injection 7.7 mL (7.7 mLs Intravenous Contrast Given 12/14/21 1350)  HYDROcodone-acetaminophen (NORCO/VICODIN) 5-325 MG per  tablet 1 tablet (1 tablet Oral Given 12/14/21 1618)    ED Course  I have reviewed the triage vital signs and the nursing notes.  Pertinent labs & imaging results that were available during my care of the patient were reviewed by me and considered in my medical decision making (see chart for details).    MDM Rules/Calculators/A&P                          Patient presents with acute on chronic radicular back pain as described in HPI above.  She denies red flag symptoms such as saddle anesthesia or urinary incontinence.  She denies any new trauma to the area.  Her pain is not improved with OTC pain medications, muscle relaxers, or steroid injections received at urgent care last night.  MRI reveals left L2-L3 nerve root impingement which is new compared to prior MRI from 2021.  Imaging findings correlate with patient's new left radicular symptoms.  No evidence of cauda equina syndrome on MRI.  Patient states that the [place] she was given earlier provided relief of her pain.  We will plan to prescribe short course of Norco until patient can follow-up with her spine surgeon.  Recommended continued multimodal treatment with OTC pain medications, muscle relaxers, prednisone course as previously prescribed.  I instructed her to only use Norco in case of breakthrough pain.  During evaluation, the patient incidentally mentioned her left eye has been bothering her.  It is injected on exam. She was  reportedly prescribed antibiotic drops at urgent care the other night.  On fluorescein staining, the patient does appear to have a small corneal abrasion on the medial aspect of the patient's left cornea.  Given that she has already been prescribed antibiotic drops, no further treatment is indicated.  Discharge instruction and return precautions were discussed with the patient prior to discharge and included in the AVS.  She voiced understanding of these instructions and was amenable with the plan as described.  Patient was then discharged in stable condition.  Final Clinical Impression(s) / ED Diagnoses Final diagnoses:  Lumbar radiculopathy    Rx / DC Orders ED Discharge Orders          Ordered    HYDROcodone-acetaminophen (NORCO) 5-325 MG tablet  Every 6 hours PRN        12/14/21 1840             Jeslie Lowe, Amalia Hailey, MD 12/15/21 1105    Sherwood Gambler, MD 12/16/21 1506

## 2021-12-14 NOTE — ED Provider Notes (Signed)
Emergency Medicine Provider Triage Evaluation Note  Melissa Dudley , a 60 y.o. female  was evaluated in triage.  Pt complains of lower back pain.  Patient has history of slipped disc, went to [facility] urgent care last night and received 2 steroid injections in her spine.  Chills prescribed steroids and muscle relaxer, and states that none of this has helped her.  Review of Systems  Positive: Back pain, intermittent numbness in left leg Negative: Fever, chills, saddle anesthesia, urinary retention or incontinence  Physical Exam  LMP 01/25/2012  Gen:   Awake, no distress   Resp:  Normal effort  MSK:   Moves extremities without difficulty  Other:  Difficulty walking due to pain  Medical Decision Making  Medically screening exam initiated at 11:14 AM.  Appropriate orders placed.  Jarome Lamas was informed that the remainder of the evaluation will be completed by another provider, this initial triage assessment does not replace that evaluation, and the importance of remaining in the ED until their evaluation is complete.     Jeanella Flattery 12/14/21 1139    Ernie Avena, MD 12/14/21 1209

## 2022-01-16 ENCOUNTER — Other Ambulatory Visit: Payer: Self-pay | Admitting: Family Medicine

## 2022-01-16 DIAGNOSIS — Z1231 Encounter for screening mammogram for malignant neoplasm of breast: Secondary | ICD-10-CM

## 2022-01-21 ENCOUNTER — Other Ambulatory Visit: Payer: Self-pay

## 2022-01-21 ENCOUNTER — Ambulatory Visit
Admission: RE | Admit: 2022-01-21 | Discharge: 2022-01-21 | Disposition: A | Discharge status: Home / Self Care | Payer: BC Managed Care – PPO | Source: Ambulatory Visit | Attending: Family Medicine | Admitting: Family Medicine

## 2022-01-21 DIAGNOSIS — Z1231 Encounter for screening mammogram for malignant neoplasm of breast: Secondary | ICD-10-CM

## 2022-04-11 NOTE — Progress Notes (Signed)
?Cardiology Office Note:   ? ?Date:  04/12/2022  ? ?ID:  Melissa Dudley, DOB Dec 18, 1960, MRN [id number] ? ?PCP:  Sigmund Hazel, MD  ?Cardiologist:  None  ?Electrophysiologist:  None  ? ?Referring MD: Barbie Banner, MD  ? ?Chief Complaint  ?Patient presents with  ? Chest Pain  ? ? ?History of Present Illness:   ? ?Melissa Dudley is a 61 y.o. female with a hx of GERD, hypertension, hyperlipidemia, alcohol abuse who is referred by Dr. Andrey Campanile for evaluation of palpitations.  She reports she has been having fluttering in her chest, occurring almost daily.  Last for about 5 minutes.  Also reports has been having chest tightness about once per week.  Describes as tightness in center of her chest, lasting for couple minutes.  She walks 15 to 20 minutes 3-4 times per week, has not noted chest pain with exertion.  However does report she has been having dyspnea with exertion.  Reports she feels short of breath with walking up 1 flight of stairs.  She denies any lightheadedness, syncope, or lower extremity edema.  Smoked 0.5 packs/day for less than 10 years, quit in 2003.  Drinks 1 glass of wine per day.  Drinks 4 cups per coffee per day and 1 Diet Coke.  Family history includes maternal grandfather had CVA in 18s. ? ? ? ? ?Past Medical History:  ?Diagnosis Date  ? Depression   ? GERD (gastroesophageal reflux disease)   ? stress related  ? Hypertension   ? Lumbar herniated disc   ? Mental disorder   ? ? ?Past Surgical History:  ?Procedure Laterality Date  ? CESAREAN SECTION    ? DILATATION & CURRETTAGE/HYSTEROSCOPY WITH RESECTOCOPE  01/18/2013  ? Procedure: DILATATION & CURETTAGE/HYSTEROSCOPY WITH RESECTOCOPE;  Surgeon: Serita Kyle, MD;  Location: [facility];  Service: Gynecology;  Laterality: N/A;  ? REDUCTION MAMMAPLASTY Bilateral 04/15/2017  ? ? ?Current Medications: ?Current Meds  ?Medication Sig  ? buPROPion (WELLBUTRIN XL) 300 MG 24 hr tablet Take 300 mg by mouth in the morning.  ? citalopram (CELEXA) 40 MG tablet Take  40 mg by mouth in the morning.  ? clobetasol ointment (TEMOVATE) 0.05 % Apply 1 application topically daily as needed. For eczema itching  ? fexofenadine (ALLEGRA) 180 MG tablet Take 180 mg by mouth daily as needed for allergies.   ? fluticasone (FLONASE) 50 MCG/ACT nasal spray Place 1-2 sprays into both nostrils daily as needed for allergies or rhinitis.  ? ibuprofen (ADVIL,MOTRIN) 600 MG tablet Take 600 mg by mouth 3 (three) times daily as needed for headache or mild pain.  ? lisinopril (PRINIVIL,ZESTRIL) 10 MG tablet Take 1 tablet (10 mg total) by mouth daily.  ? lisinopril-hydrochlorothiazide (ZESTORETIC) 20-25 MG tablet Take 1 tablet by mouth daily.  ? Melatonin 10 MG TABS Take 10 mg by mouth at bedtime.  ? metoprolol tartrate (LOPRESSOR) 25 MG tablet Take 1 tablet (25 mg) TWO hours prior to CT scan  ?  ? ?Allergies:   Bee venom and Oxycodone hcl  ? ?Social History  ? ?Socioeconomic History  ? Marital status: Married  ?  Spouse name: Not on file  ? Number of children: Not on file  ? Years of education: Not on file  ? Highest education level: Not on file  ?Occupational History  ? Not on file  ?Tobacco Use  ? Smoking status: Former  ?  Types: Cigarettes  ?  Quit date: 01/12/2006  ?  Years since quitting:  16.2  ? Smokeless tobacco: Never  ?Substance and Sexual Activity  ? Alcohol use: No  ? Drug use: No  ? Sexual activity: Not on file  ?Other Topics Concern  ? Not on file  ?Social History Narrative  ? Not on file  ? ?Social Determinants of Health  ? ?Financial Resource Strain: Not on file  ?Food Insecurity: Not on file  ?Transportation Needs: Not on file  ?Physical Activity: Not on file  ?Stress: Not on file  ?Social Connections: Not on file  ?  ? ?Family History: ? Family history includes maternal grandfather had CVA in 5170s. ? ?ROS:   ?Please see the history of present illness.    ? All other systems reviewed and are negative. ? ?EKGs/Labs/Other Studies Reviewed:   ? ?The following studies were reviewed  today: ? ? ?EKG:   ?04/12/22: NSR, rate 62, nonspecific T wave flattening ? ?Recent Labs: ?No results found for requested labs within last 8760 hours.  ?Recent Lipid Panel ?No results found for: CHOL, TRIG, HDL, CHOLHDL, VLDL, LDLCALC, LDLDIRECT ? ?Physical Exam:   ? ?VS:  BP 130/76   Pulse 62   Ht 5\' 3"  (1.6 m)   Wt 175 lb 6.4 oz (79.6 kg)   LMP 01/25/2012   SpO2 97%   BMI 31.07 kg/m?    ? ?Wt Readings from Last 3 Encounters:  ?04/12/22 175 lb 6.4 oz (79.6 kg)  ?12/14/21 170 lb (77.1 kg)  ?09/15/18 163 lb (73.9 kg)  ?  ? ?GEN:  Well nourished, well developed in no acute distress ?HEENT: Normal ?NECK: No JVD; No carotid bruits ?LYMPHATICS: No lymphadenopathy ?CARDIAC: RRR, no murmurs, rubs, gallops ?RESPIRATORY:  Clear to auscultation without rales, wheezing or rhonchi  ?ABDOMEN: Soft, non-tender, non-distended ?MUSCULOSKELETAL:  No edema; No deformity  ?SKIN: Warm and dry ?NEUROLOGIC:  Alert and oriented x 3 ?PSYCHIATRIC:  Normal affect  ? ?ASSESSMENT:   ? ?1. Chest pain of uncertain etiology   ?2. DOE (dyspnea on exertion)   ?3. Palpitations   ?4. Essential hypertension   ?5. Hyperlipidemia, unspecified hyperlipidemia type   ? ?PLAN:   ? ?Chest pain/DOE: Atypical in description but does have significant CAD risk factors (age, hypertension, hyperlipidemia, former tobacco use).  Also having dyspnea with exertion that could represent anginal equivalent. ?-Recommend coronary CTA to rule out obstructive CAD.  Will give Lopressor 25 mg prior to study ?-Echocardiogram to evaluate for structural heart disease ? ?Palpitations: Description concerning for arrhythmia, will evaluate with Zio patch x7 days ? ?Hypertension: On lisinopril-HCTZ 20-25 mg daily.  Appears controlled.  Check BMET ? ?Hyperlipidemia: LDL 166 on 03/11/2022.  10-year ASCVD risk or 5%, not meeting indication for statin at this time.  Will follow-up results of coronary CTA for further risk stratification as above ? ?RTC in 6 months ? ?Medication  Adjustments/Labs and Tests Ordered: ?Current medicines are reviewed at length with the patient today.  Concerns regarding medicines are outlined above.  ?Orders Placed This Encounter  ?Procedures  ? CT CORONARY MORPH W/CTA COR W/SCORE W/CA W/CM &/OR WO/CM  ? Basic metabolic panel  ? Magnesium  ? LONG TERM MONITOR (3-14 DAYS)  ? EKG 12-Lead  ? ECHOCARDIOGRAM COMPLETE  ? ?Meds ordered this encounter  ?Medications  ? metoprolol tartrate (LOPRESSOR) 25 MG tablet  ?  Sig: Take 1 tablet (25 mg) TWO hours prior to CT scan  ?  Dispense:  1 tablet  ?  Refill:  0  ? ? ?Patient Instructions  ?Medication Instructions:  ?  Your physician recommends that you continue on your current medications as directed. Please refer to the Current Medication list given to you today. ? ?*If you need a refill on your cardiac medications before your next appointment, please call your pharmacy* ? ? ?Lab Work: ?BMET, [place] today ? ?If you have labs (blood work) drawn today and your tests are completely normal, you will receive your results only by: ?MyChart Message (if you have MyChart) OR ?A paper copy in the mail ?If you have any lab test that is abnormal or we need to change your treatment, we will call you to review the results. ? ? ?Testing/Procedures: ?Coronary CTA-see instructions below ? ?Your physician has requested that you have an echocardiogram. Echocardiography is a painless test that uses sound waves to create images of your heart. It provides your doctor with information about the size and shape of your heart and how well your heart?s chambers and valves are working. This procedure takes approximately one hour. There are no restrictions for this procedure. ? ?ZIO XT- Long Term Monitor Instructions  ? ?Your physician has requested you wear a ZIO patch monitor for _7_ days.  ?This is a single patch monitor.   [employer] supplies one patch monitor per enrollment. Additional stickers are not available. Please do not apply patch if you will be  having a Nuclear Stress Test, Echocardiogram, Cardiac CT, MRI, or Chest Xray during the period you would be wearing the monitor. The patch cannot be worn during these tests. You cannot remove and re-apply the ZIO XT patch m

## 2022-04-12 ENCOUNTER — Encounter: Payer: Self-pay | Admitting: Cardiology

## 2022-04-12 ENCOUNTER — Ambulatory Visit: Payer: BC Managed Care – PPO | Admitting: Cardiology

## 2022-04-12 ENCOUNTER — Ambulatory Visit (INDEPENDENT_AMBULATORY_CARE_PROVIDER_SITE_OTHER): Payer: BC Managed Care – PPO

## 2022-04-12 VITALS — BP 130/76 | HR 62 | Ht 63.0 in | Wt 175.4 lb

## 2022-04-12 DIAGNOSIS — R002 Palpitations: Secondary | ICD-10-CM | POA: Diagnosis not present

## 2022-04-12 DIAGNOSIS — R079 Chest pain, unspecified: Secondary | ICD-10-CM

## 2022-04-12 DIAGNOSIS — E785 Hyperlipidemia, unspecified: Secondary | ICD-10-CM

## 2022-04-12 DIAGNOSIS — I10 Essential (primary) hypertension: Secondary | ICD-10-CM

## 2022-04-12 DIAGNOSIS — R0609 Other forms of dyspnea: Secondary | ICD-10-CM

## 2022-04-12 MED ORDER — METOPROLOL TARTRATE 25 MG PO TABS: ORAL_TABLET | ORAL | 0 refills | Status: DC

## 2022-04-12 NOTE — Patient Instructions (Addendum)
Medication Instructions:  ?Your physician recommends that you continue on your current medications as directed. Please refer to the Current Medication list given to you today. ? ?*If you need a refill on your cardiac medications before your next appointment, please call your pharmacy* ? ? ?Lab Work: ?BMET, [place] today ? ?If you have labs (blood work) drawn today and your tests are completely normal, you will receive your results only by: ?MyChart Message (if you have MyChart) OR ?A paper copy in the mail ?If you have any lab test that is abnormal or we need to change your treatment, we will call you to review the results. ? ? ?Testing/Procedures: ?Coronary CTA-see instructions below ? ?Your physician has requested that you have an echocardiogram. Echocardiography is a painless test that uses sound waves to create images of your heart. It provides your doctor with information about the size and shape of your heart and how well your heart?s chambers and valves are working. This procedure takes approximately one hour. There are no restrictions for this procedure. ? ?ZIO XT- Long Term Monitor Instructions  ? ?Your physician has requested you wear a ZIO patch monitor for _7_ days.  ?This is a single patch monitor.   [employer] supplies one patch monitor per enrollment. Additional stickers are not available. Please do not apply patch if you will be having a Nuclear Stress Test, Echocardiogram, Cardiac CT, MRI, or Chest Xray during the period you would be wearing the monitor. The patch cannot be worn during these tests. You cannot remove and re-apply the ZIO XT patch monitor.  ?Your ZIO patch monitor will be sent Fed Ex from [employer] directly to your home address. It may take 3-5 days to receive your monitor after you have been enrolled.  ?Once you have received your monitor, please review the enclosed instructions. Your monitor has already been registered assigning a specific monitor serial # to you. ? ?Billing and  Patient Assistance Program Information  ? ?We have supplied [employer] with any of your insurance information on file for billing purposes. ?[employer] offers a sliding scale Patient Assistance Program for patients that do not have insurance, or whose insurance does not completely cover the cost of the ZIO monitor.   You must apply for the Patient Assistance Program to qualify for this discounted rate.     To apply, please call [employer] at [phone], select option 4, then select option 2, and ask to apply for Patient Assistance Program.  Theodore Demark will ask your household income, and how many people are in your household.  They will quote your out-of-pocket cost based on that information.  [employer] will also be able to set up a 29-month, interest-free payment plan if needed. ? ?Applying the monitor  ? ?Shave hair from upper left chest.  ?Hold abrader disc by orange tab. Rub abrader in 40 strokes over the upper left chest as indicated in your monitor instructions.  ?Clean area with 4 enclosed alcohol pads. Let dry.  ?Apply patch as indicated in monitor instructions. Patch will be placed under collarbone on left side of chest with arrow pointing upward.  ?Rub patch adhesive wings for 2 minutes. Remove white label marked "1". Remove the white label marked "2". Rub patch adhesive wings for 2 additional minutes.  ?While looking in a mirror, press and release button in center of patch. A small green light will flash 3-4 times. This will be your only indicator that the monitor has been turned on. ?  ?Do not shower  for the first 24 hours. You may shower after the first 24 hours.  ?Press the button if you feel a symptom. You will hear a small click. Record Date, Time and Symptom in the Patient Logbook.  ?When you are ready to remove the patch, follow instructions on the last 2 pages of the Patient Logbook. Stick patch monitor onto the last page of Patient Logbook.  ?Place Patient Logbook in the blue and white box.  Use locking tab  on box and tape box closed securely.  The blue and white box has prepaid postage on it. Please place it in the mailbox as soon as possible. Your physician should have your test results approximately 7 days after the monitor has been mailed back to [facility].  ?Call [facility] at [phone] if you have questions regarding your ZIO XT patch monitor. Call them immediately if you see an orange light blinking on your monitor.  ?If your monitor falls off in less than 4 days, contact our Monitor department at [phone]. ?If your monitor becomes loose or falls off after 4 days call [employer] at [phone] for suggestions on securing your monitor.? ? ?Follow-Up: ?At [facility], you and your health needs are our priority.  As part of our continuing mission to provide you with exceptional heart care, we have created designated Provider Care Teams.  These Care Teams include your primary Cardiologist (physician) and Advanced Practice Providers (APPs -  Physician Assistants and Nurse Practitioners) who all work together to provide you with the care you need, when you need it. ? ?We recommend signing up for the patient portal called "MyChart".  Sign up information is provided on this After Visit Summary.  MyChart is used to connect with patients for Virtual Visits (Telemedicine).  Patients are able to view lab/test results, encounter notes, upcoming appointments, etc.  Non-urgent messages can be sent to your provider as well.   ?To learn more about what you can do with MyChart, go to [email or web address].   ? ?Your next appointment:   ?6 month(s) ? ?The format for your next appointment:   ?In Person ? ?Provider:   ?Dr. Gardiner Rhyme ? ?Other Instructions ? ? ?Your cardiac CT will be scheduled at one of the below locations:  ? ?[facility] ?[address] ?[place], [place] [address] ?[phone] ? ?OR ? ?[facility] [address] ?[address] ?[place], [place] [address] ?[phone] ? ?If scheduled at [facility], please arrive at the [facility] and Children's Entrance (Entrance C2) of [facility] 30 minutes prior to test start time. ?You can use the FREE valet parking offered at entrance C (encouraged to control the heart rate for the test)  ?Proceed to the [facility] Radiology Department (first floor) to check-in and test prep. ? ?All radiology patients and guests should use entrance C2 at [facility], accessed from [facility], even though the hospital's physical address listed is [address]. ? ? ? ?If scheduled at [facility], please arrive 15 mins early for check-in and test prep. ? ?Please follow these instructions carefully (unless otherwise directed): ? ?On the Night Before the Test: ?Be sure to Drink plenty of water. ?Do not consume any caffeinated/decaffeinated beverages or chocolate 12 hours prior to your test. ?Do not take any antihistamines 12 hours prior to your test. ? ?On the Day of the Test: ?Drink plenty of water until 1 hour prior to the test. ?Do  not eat any food 4 hours prior to the test. ?You may take your regular medications prior to the test.  ?Take metoprolol (Lopressor) two hours prior to test. ?HOLD lisinopril/Hydrochlorothiazide morning of the test. ?FEMALES- please wear underwire-free bra if available, avoid dresses & tight clothing ?     ?After the Test: ?Drink plenty of water. ?After receiving IV contrast, you may experience a mild flushed feeling. This is normal. ?On occasion, you may experience a mild rash up to 24 hours after the test. This is not dangerous. If this occurs, you can take Benadryl 25 mg and increase your fluid intake. ?If you experience trouble breathing, this can be serious. If it is severe call 911 IMMEDIATELY. If it is mild, please call our office. ?If you take any of these medications: Glipizide/Metformin, Avandament, Glucavance,  please do not take 48 hours after completing test unless otherwise instructed. ? ?We will call to schedule your test 2-4 weeks out understanding that some insurance companies will need an authorization prior t

## 2022-04-12 NOTE — Progress Notes (Unsigned)
Enrolled patient for a 7 day Zio XT monitor to be mailed to patients home.  

## 2022-04-13 LAB — BASIC METABOLIC PANEL
BUN/Creatinine Ratio: 21 (ref 12–28)
BUN: 17 mg/dL (ref 8–27)
CO2: 25 mmol/L (ref 20–29)
Calcium: 9.8 mg/dL (ref 8.7–10.3)
Chloride: 101 mmol/L (ref 96–106)
Creatinine, Ser: 0.82 mg/dL (ref 0.57–1.00)
Glucose: 95 mg/dL (ref 70–99)
Potassium: 4.6 mmol/L (ref 3.5–5.2)
Sodium: 140 mmol/L (ref 134–144)
eGFR: 81 mL/min/{1.73_m2} (ref >59)

## 2022-04-13 LAB — MAGNESIUM: Magnesium: 2.1 mg/dL (ref 1.6–2.3)

## 2022-04-16 DIAGNOSIS — R002 Palpitations: Secondary | ICD-10-CM

## 2022-04-17 ENCOUNTER — Telehealth: Payer: Self-pay | Admitting: Cardiology

## 2022-04-17 ENCOUNTER — Encounter: Payer: Self-pay | Admitting: *Deleted

## 2022-04-17 NOTE — Telephone Encounter (Signed)
Patient calling because she unable to wear her heart montior for 7 days because of the echocardiogram she has. Please advise ?

## 2022-04-17 NOTE — Telephone Encounter (Signed)
Contacted patient, advised that she was currently wearing the monitor- it would be over on Tuesday 05/09. Her ECHO is scheduled for 05/08- she has a CTA scheduled for 05/10- we need to push her ECHO back so she can finish wearing the monitor and have the ECHO completed another day next week.  ? ?Advised I would route to scheduling to just push ECHO a few days.  ? ?Thanks! ?

## 2022-04-22 ENCOUNTER — Ambulatory Visit (HOSPITAL_COMMUNITY): Payer: BC Managed Care – PPO | Attending: Cardiology

## 2022-04-22 DIAGNOSIS — R079 Chest pain, unspecified: Secondary | ICD-10-CM | POA: Diagnosis present

## 2022-04-22 LAB — ECHOCARDIOGRAM COMPLETE
Area-P 1/2: 4.1 cm2
S' Lateral: 2.5 cm

## 2022-04-23 ENCOUNTER — Telehealth (HOSPITAL_COMMUNITY): Payer: Self-pay | Admitting: *Deleted

## 2022-04-23 NOTE — Telephone Encounter (Signed)
Reaching out to patient to offer assistance regarding upcoming cardiac imaging study; pt verbalizes understanding of appt date/time, parking situation and where to check in, pre-test NPO status and medications ordered, and verified current allergies; name and call back number provided for further questions should they arise ? ?Gordy Clement RN Navigator Cardiac Imaging ?[facility] ?[phone] office ?[phone] cell ? ?Patient to take 25mg  metoprolol tartrate two hours prior to her cardiac CT scan.  She is aware to arrive at 1:30pm. ?

## 2022-04-24 ENCOUNTER — Encounter (HOSPITAL_COMMUNITY): Payer: Self-pay

## 2022-04-24 ENCOUNTER — Ambulatory Visit (HOSPITAL_COMMUNITY)
Admission: RE | Admit: 2022-04-24 | Discharge: 2022-04-24 | Disposition: A | Discharge status: Home / Self Care | Payer: BC Managed Care – PPO | Source: Ambulatory Visit | Attending: Cardiology | Admitting: Cardiology

## 2022-04-24 DIAGNOSIS — R079 Chest pain, unspecified: Secondary | ICD-10-CM | POA: Insufficient documentation

## 2022-04-24 IMAGING — CT CT HEART MORP W/ CTA COR W/ SCORE W/ CA W/CM &/OR W/O CM
4 of 7 series · 8 of 20 positions shown, 9 images · IV contrast (APPLIED)
Comparison: None Available.
COMPARISON: None Available.

Addendum:
EXAM:
OVER-READ INTERPRETATION  CT CHEST

The following report is an over-read performed by radiologist Dr.
Vleta Pofuk [REDACTED] on 04/24/2022. This
over-read does not include interpretation of cardiac or coronary
anatomy or pathology. The coronary calcium score/coronary CTA
interpretation by the cardiologist is attached.
CLINICAL DATA: 61F with chest pain
Cardiac/Coronary CTA
TECHNIQUE: The patient was scanned on a [employer] Force scanner.

[Series 6: best diast · axial · 0.39mm/px · z∈[+1198,+1234]mm · 2 of 271 slices shown, 3 images]
[im 91/271  vessel]
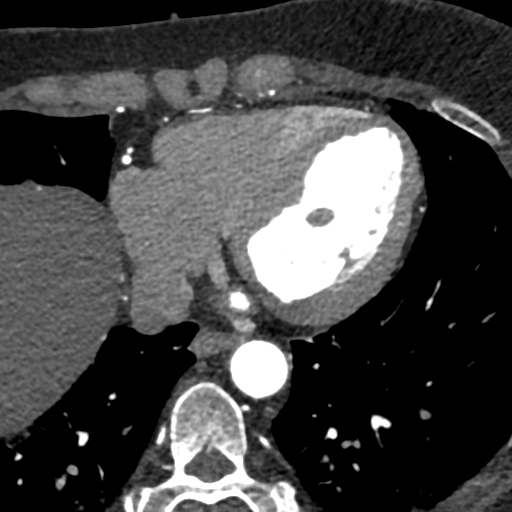
[im 91/271  lung]
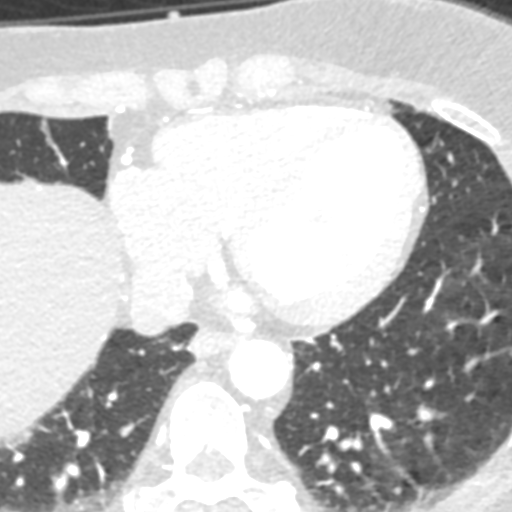
[im 181/271  vessel]
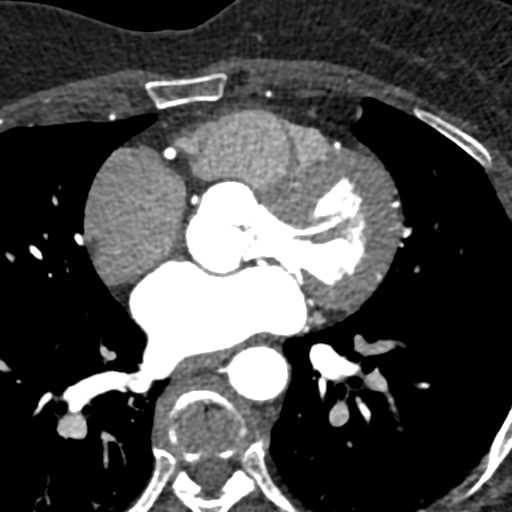

[Series 7: best syst · axial · 0.39mm/px · z∈[+1198,+1234]mm · 2 of 271 slices shown]
[im 91/271  vessel]
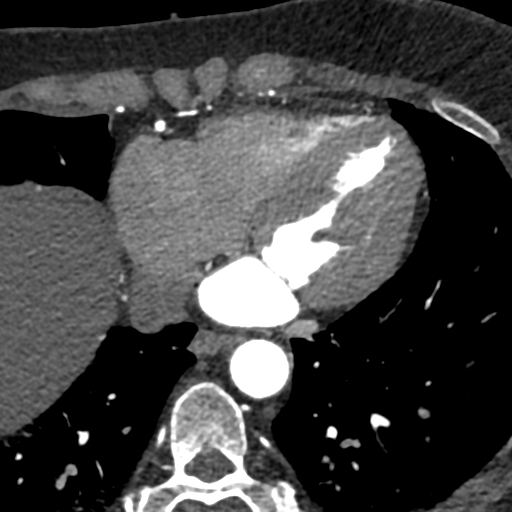
[im 181/271  vessel]
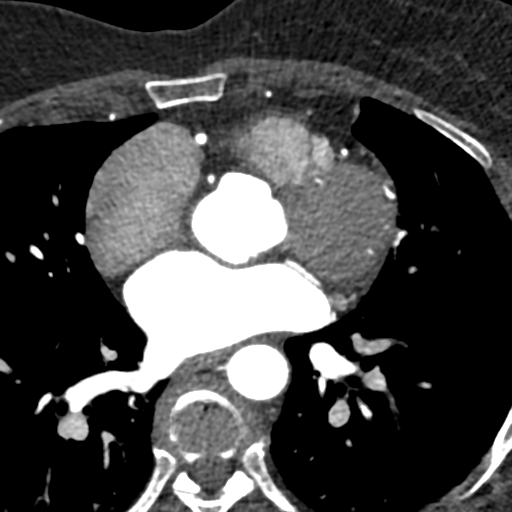

[Series 8: ts diast sharp · axial · 0.39mm/px · z∈[+1198,+1234]mm · 2 of 271 slices shown]
[im 91/271  lung]
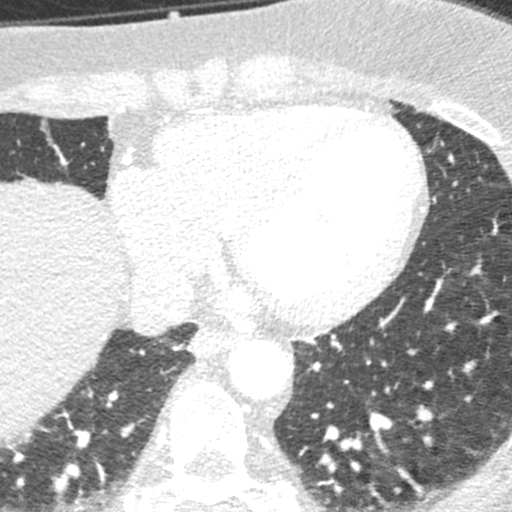
[im 181/271  lung]
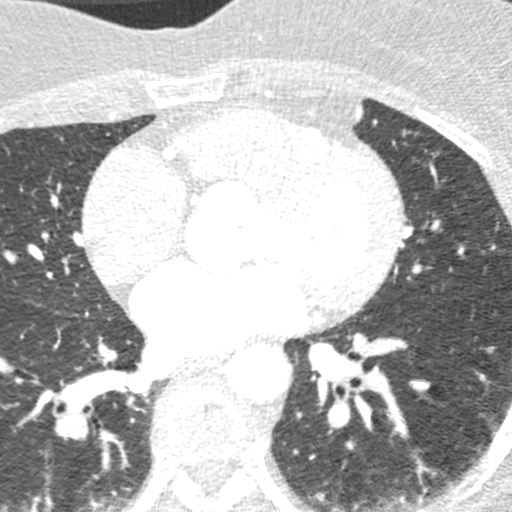

[Series 9: ts syst sharp · axial · 0.39mm/px · z∈[+1198,+1234]mm · 2 of 271 slices shown]
[im 91/271  lung]
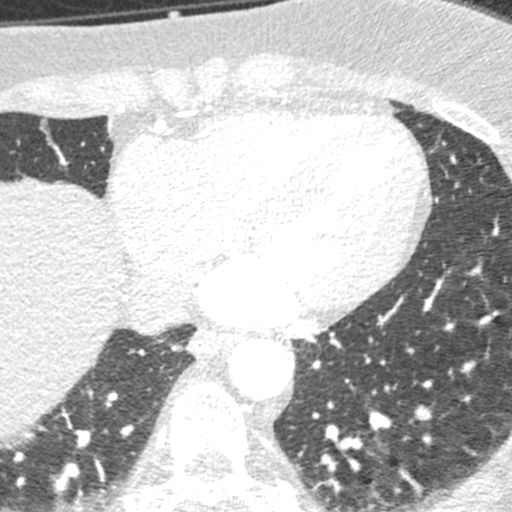
[im 181/271  lung]
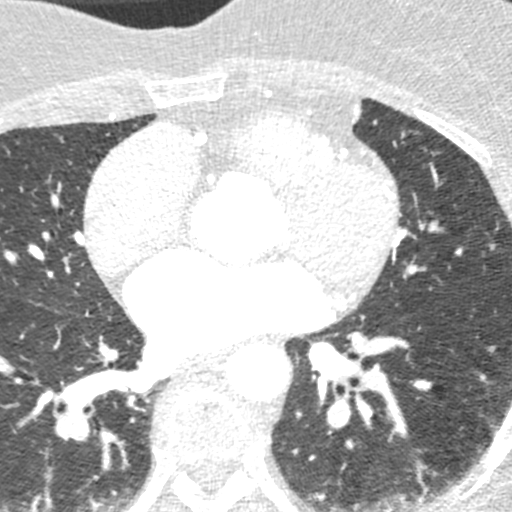

[8 of 20 positions shown; findings below may reference images not displayed]

FINDINGS: Vascular: There are no significant vascular findings.

Mediastinum/Nodes: There are no enlarged lymph nodes.The visualized
esophagus demonstrates no significant findings.

Lungs/Pleura: Minimal dependent subsegmental atelectasis in the
posterior right greater than left lower lobes. No pneumothorax or
pleural effusion.

Upper abdomen: No acute abnormality.

Musculoskeletal/Chest wall: No chest wall abnormality. No acute or
significant osseous findings.
IMPRESSION: 1. No significant extracardiac findings.
FINDINGS: A 100 kV prospective scan was triggered in the descending thoracic
aorta at 111 HU's. Axial non-contrast 3 mm slices were carried out
through the heart. The data set was analyzed on a dedicated work
station and scored using the Agatson method. Gantry rotation speed
was 250 msecs and collimation was .6 mm. 0.8 mg of sl NTG was given.
The 3D data set was reconstructed in 5% intervals of the 35-75% of
the R-R cycle. Phases were analyzed on a dedicated work station
using MPR, MIP and VRT modes. The patient received 80 cc of
contrast.

Coronary Arteries: Anomalous origin of LCX from RCA with retroaortic
course. Right dominance.

RCA is a large dominant artery that gives rise to PDA and PLA. There
is no plaque.

Left main is a large artery that gives rise to LAD and LCX arteries.

LAD is a large vessel that has no plaque.

LCX is a non-dominant artery.  There is no plaque.

Other findings:

Left Ventricle: Normal size

Left Atrium: Normal size. PFO

Pulmonary Veins: Normal configuration

Right Ventricle: Normal size

Right Atrium: Normal size

Cardiac valves: No calcifications

Thoracic aorta: Normal size

Pulmonary Arteries: Normal size

Systemic Veins: Normal drainage

Pericardium: Normal thickness
IMPRESSION: 1. Coronary calcium score of 0.

2. Anomalous origin of LCX from RCA with retroaortic course

3. No evidence of CAD.

4. PFO

CAD-RADS 0. No evidence of CAD (0%). Consider non-atherosclerotic
causes of chest pain.

*** End of Addendum ***
EXAM:
OVER-READ INTERPRETATION  CT CHEST

The following report is an over-read performed by radiologist Dr.
Vleta Pofuk [REDACTED] on 04/24/2022. This
over-read does not include interpretation of cardiac or coronary
anatomy or pathology. The coronary calcium score/coronary CTA
interpretation by the cardiologist is attached.
FINDINGS: Vascular: There are no significant vascular findings.

Mediastinum/Nodes: There are no enlarged lymph nodes.The visualized
esophagus demonstrates no significant findings.

Lungs/Pleura: Minimal dependent subsegmental atelectasis in the
posterior right greater than left lower lobes. No pneumothorax or
pleural effusion.

Upper abdomen: No acute abnormality.

Musculoskeletal/Chest wall: No chest wall abnormality. No acute or
significant osseous findings.
IMPRESSION: 1. No significant extracardiac findings.

## 2022-04-24 MED ORDER — IOHEXOL 350 MG/ML SOLN
100.0000 mL | Freq: Once | INTRAVENOUS | Status: AC | PRN
  Administered 2022-04-24: 100 mL via INTRAVENOUS

## 2022-04-24 MED ORDER — NITROGLYCERIN 0.4 MG SL SUBL
SUBLINGUAL_TABLET | SUBLINGUAL | Status: AC
  Filled 2022-04-24: qty 2

## 2022-04-24 MED ORDER — NITROGLYCERIN 0.4 MG SL SUBL
0.8000 mg | SUBLINGUAL_TABLET | Freq: Once | SUBLINGUAL | Status: AC
  Administered 2022-04-24: 0.8 mg via SUBLINGUAL

## 2022-10-13 NOTE — Progress Notes (Unsigned)
Cardiology Office Note:    Date:  10/14/2022   ID:  Melissa Dudley, DOB Oct 08, 1961, MRN [id number]  PCP:  Kathyrn Lass, MD  Cardiologist:  None  Electrophysiologist:  None   Referring MD: Kathyrn Lass, MD   Chief Complaint  Patient presents with   Chest Pain    History of Present Illness:    Melissa Dudley is a 61 y.o. female with a hx of GERD, hypertension, hyperlipidemia, alcohol abuse who presents for follow-up.  Melissa Dudley was referred by Dr. Redmond Pulling for evaluation of palpitations, initially seen 04/12/2022.  Melissa Dudley reports Melissa Dudley has been having fluttering in her chest, occurring almost daily.  Last for about 5 minutes.  Also reports has been having chest tightness about once per week.  Describes as tightness in center of her chest, lasting for couple minutes.  Melissa Dudley walks 15 to 20 minutes 3-4 times per week, has not noted chest pain with exertion.  However does report Melissa Dudley has been having dyspnea with exertion.  Reports Melissa Dudley feels short of breath with walking up 1 flight of stairs.  Melissa Dudley denies any lightheadedness, syncope, or lower extremity edema.  Smoked 0.5 packs/day for less than 10 years, quit in 2003.  Drinks 1 glass of wine per day.  Drinks 4 cups per coffee per day and 1 Diet Coke.  Family history includes maternal grandfather had CVA in 52s.  Echocardiogram 04/22/2022 showed normal biventricular function, grade 1 diastolic dysfunction, no significant valvular disease.  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  Zio patch x7 days showed 6 episodes of SVT, longest lasting 18 beats, no significant arrhythmias.  Since last clinic visit, Melissa Dudley reports that Melissa Dudley is doing well.  States that chest pain and dyspnea have resolved.  Denies any lightheadedness, syncope, or lower extremity edema.  Palpitations have been rare but did have episode this morning lasting for few minutes.    Past Medical History:  Diagnosis  Date   Depression    GERD (gastroesophageal reflux disease)    stress related   Hypertension    Lumbar herniated disc    Mental disorder     Past Surgical History:  Procedure Laterality Date   CESAREAN SECTION     DILATATION & CURRETTAGE/HYSTEROSCOPY WITH RESECTOCOPE  01/18/2013   Procedure: DILATATION & CURETTAGE/HYSTEROSCOPY WITH RESECTOCOPE;  Surgeon: Marvene Staff, MD;  Location: [facility];  Service: Gynecology;  Laterality: N/A;   REDUCTION MAMMAPLASTY Bilateral 04/15/2017    Current Medications: Current Meds  Medication Sig   buPROPion (WELLBUTRIN XL) 300 MG 24 hr tablet Take 300 mg by mouth in the morning.   citalopram (CELEXA) 40 MG tablet Take 40 mg by mouth in the morning.   clobetasol ointment (TEMOVATE) 3.66 % Apply 1 application topically daily as needed. For eczema itching   fexofenadine (ALLEGRA) 180 MG tablet Take 180 mg by mouth daily as needed for allergies.    fluticasone (FLONASE) 50 MCG/ACT nasal spray Place 1-2 sprays into both nostrils daily as needed for allergies or rhinitis.   ibuprofen (ADVIL,MOTRIN) 600 MG tablet Take 600 mg by mouth 3 (three) times daily as needed for headache or mild pain.   lisinopril-hydrochlorothiazide (ZESTORETIC) 20-25 MG tablet Take 1 tablet by mouth daily.   Melatonin 10 MG TABS Take 10 mg by mouth at bedtime.   trimethoprim-polymyxin b (POLYTRIM) ophthalmic solution Place 1 drop into the left eye 4 (four) times daily.  Allergies:   Bee venom and Oxycodone hcl   Social History   Socioeconomic History   Marital status: Married    Spouse name: Not on file   Number of children: Not on file   Years of education: Not on file   Highest education level: Not on file  Occupational History   Not on file  Tobacco Use   Smoking status: Former    Types: Cigarettes    Quit date: 01/12/2006    Years since quitting: 16.7   Smokeless tobacco: Never  Substance and Sexual Activity   Alcohol use: No   Drug use: No   Sexual  activity: Not on file  Other Topics Concern   Not on file  Social History Narrative   Not on file   Social Determinants of Health   Financial Resource Strain: Not on file  Food Insecurity: Not on file  Transportation Needs: Not on file  Physical Activity: Not on file  Stress: Not on file  Social Connections: Not on file     Family History:  Family history includes maternal grandfather had CVA in 44s.  ROS:   Please see the history of present illness.     All other systems reviewed and are negative.  EKGs/Labs/Other Studies Reviewed:    The following studies were reviewed today:   EKG:   10/14/22: Sinus bradycardia, rate 59, incomplete right bundle branch block 04/12/22: NSR, rate 62, nonspecific T wave flattening  Recent Labs: 04/12/2022: BUN 17; Creatinine, Ser 0.82; Magnesium 2.1; Potassium 4.6; Sodium 140  Recent Lipid Panel No results found for: "CHOL", "TRIG", "HDL", "CHOLHDL", "VLDL", "LDLCALC", "LDLDIRECT"  Physical Exam:    VS:  BP 132/84   Pulse (!) 59   Ht 5\' 3"  (1.6 m)   Wt 179 lb 3.2 oz (81.3 kg)   LMP 01/25/2012   SpO2 95%   BMI 31.74 kg/m     Wt Readings from Last 3 Encounters:  10/14/22 179 lb 3.2 oz (81.3 kg)  04/12/22 175 lb 6.4 oz (79.6 kg)  12/14/21 170 lb (77.1 kg)     GEN:  Well nourished, well developed in no acute distress HEENT: Normal NECK: No JVD; No carotid bruits CARDIAC: RRR, no murmurs, rubs, gallops RESPIRATORY:  Clear to auscultation without rales, wheezing or rhonchi  ABDOMEN: Soft, non-tender, non-distended MUSCULOSKELETAL:  No edema; No deformity  SKIN: Warm and dry NEUROLOGIC:  Alert and oriented x 3 PSYCHIATRIC:  Normal affect   ASSESSMENT:    1. Chest pain of uncertain etiology   2. Essential hypertension   3. DOE (dyspnea on exertion)   4. Palpitations   5. Hyperlipidemia, unspecified hyperlipidemia type     PLAN:    Chest pain/DOE: Reported atypical chest pain and dyspnea on exertion.  Echocardiogram  04/22/2022 showed normal biventricular function, grade 1 diastolic dysfunction, no significant valvular disease.  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  -Reports chest pain and dyspnea have resolved, no further work-up at this time  Palpitations:Zio patch x7 days showed 6 episodes of SVT, longest lasting 18 beats, no significant arrhythmias.  Hypertension: On lisinopril-HCTZ 20-25 mg daily.  Appears controlled.    Hyperlipidemia: LDL 166 on 03/11/2022.  10-year ASCVD risk or 5%, not meeting indication for statin at this time.  Calcium score 0 on 04/24/2022.  Diet/exercise recommended  RTC in 1 year  Medication Adjustments/Labs and Tests Ordered: Current medicines are reviewed  at length with the patient today.  Concerns regarding medicines are outlined above.  Orders Placed This Encounter  Procedures   EKG 12-Lead   No orders of the defined types were placed in this encounter.   Patient Instructions  Medication Instructions:  Your physician recommends that you continue on your current medications as directed. Please refer to the Current Medication list given to you today.  *If you need a refill on your cardiac medications before your next appointment, please call your pharmacy*  Follow-Up: At [facility], you and your health needs are our priority.  As part of our continuing mission to provide you with exceptional heart care, we have created designated Provider Care Teams.  These Care Teams include your primary Cardiologist (physician) and Advanced Practice Providers (APPs -  Physician Assistants and Nurse Practitioners) who all work together to provide you with the care you need, when you need it.  We recommend signing up for the patient portal called "MyChart".  Sign up information is provided on this After Visit Summary.  MyChart is used to connect with patients for Virtual Visits  (Telemedicine).  Patients are able to view lab/test results, encounter notes, upcoming appointments, etc.  Non-urgent messages can be sent to your provider as well.   To learn more about what you can do with MyChart, go to [email or web address].    Your next appointment:   12 month(s)  The format for your next appointment:   In Person  Provider:   Dr. Bjorn Pippin        Signed, Little Ishikawa, MD  10/14/2022 8:10 PM    [facility]

## 2022-10-14 ENCOUNTER — Ambulatory Visit: Payer: BC Managed Care – PPO | Attending: Cardiology | Admitting: Cardiology

## 2022-10-14 ENCOUNTER — Encounter: Payer: Self-pay | Admitting: Cardiology

## 2022-10-14 VITALS — BP 132/84 | HR 59 | Ht 63.0 in | Wt 179.2 lb

## 2022-10-14 DIAGNOSIS — R0609 Other forms of dyspnea: Secondary | ICD-10-CM | POA: Diagnosis not present

## 2022-10-14 DIAGNOSIS — R079 Chest pain, unspecified: Secondary | ICD-10-CM

## 2022-10-14 DIAGNOSIS — I10 Essential (primary) hypertension: Secondary | ICD-10-CM | POA: Diagnosis not present

## 2022-10-14 DIAGNOSIS — R002 Palpitations: Secondary | ICD-10-CM | POA: Diagnosis not present

## 2022-10-14 DIAGNOSIS — E785 Hyperlipidemia, unspecified: Secondary | ICD-10-CM

## 2022-10-14 NOTE — Patient Instructions (Signed)
Medication Instructions:  Your physician recommends that you continue on your current medications as directed. Please refer to the Current Medication list given to you today.  *If you need a refill on your cardiac medications before your next appointment, please call your pharmacy*  Follow-Up: At [facility], you and your health needs are our priority.  As part of our continuing mission to provide you with exceptional heart care, we have created designated Provider Care Teams.  These Care Teams include your primary Cardiologist (physician) and Advanced Practice Providers (APPs -  Physician Assistants and Nurse Practitioners) who all work together to provide you with the care you need, when you need it.  We recommend signing up for the patient portal called "MyChart".  Sign up information is provided on this After Visit Summary.  MyChart is used to connect with patients for Virtual Visits (Telemedicine).  Patients are able to view lab/test results, encounter notes, upcoming appointments, etc.  Non-urgent messages can be sent to your provider as well.   To learn more about what you can do with MyChart, go to [email or web address].    Your next appointment:   12 month(s)  The format for your next appointment:   In Person  Provider:   Dr. Schumann       

## 2023-01-20 ENCOUNTER — Other Ambulatory Visit: Payer: Self-pay | Admitting: Family Medicine

## 2023-01-20 DIAGNOSIS — Z1231 Encounter for screening mammogram for malignant neoplasm of breast: Secondary | ICD-10-CM

## 2023-02-17 ENCOUNTER — Ambulatory Visit
Admission: RE | Admit: 2023-02-17 | Discharge: 2023-02-17 | Disposition: A | Discharge status: Home / Self Care | Payer: BC Managed Care – PPO | Source: Ambulatory Visit | Attending: Family Medicine | Admitting: Family Medicine

## 2023-02-17 DIAGNOSIS — Z1231 Encounter for screening mammogram for malignant neoplasm of breast: Secondary | ICD-10-CM

## 2024-04-05 ENCOUNTER — Other Ambulatory Visit (HOSPITAL_COMMUNITY): Payer: Self-pay | Admitting: Orthopedic Surgery

## 2024-04-05 DIAGNOSIS — R2232 Localized swelling, mass and lump, left upper limb: Secondary | ICD-10-CM

## 2024-04-12 ENCOUNTER — Ambulatory Visit (HOSPITAL_COMMUNITY)
Admission: RE | Admit: 2024-04-12 | Discharge: 2024-04-12 | Disposition: A | Discharge status: Home / Self Care | Payer: Self-pay | Source: Ambulatory Visit | Attending: Orthopedic Surgery | Admitting: Orthopedic Surgery

## 2024-04-12 DIAGNOSIS — R2232 Localized swelling, mass and lump, left upper limb: Secondary | ICD-10-CM | POA: Insufficient documentation

## 2024-04-26 ENCOUNTER — Other Ambulatory Visit: Payer: Self-pay | Admitting: Family Medicine

## 2024-04-26 DIAGNOSIS — Z1231 Encounter for screening mammogram for malignant neoplasm of breast: Secondary | ICD-10-CM

## 2024-05-12 ENCOUNTER — Ambulatory Visit
Admission: RE | Admit: 2024-05-12 | Discharge: 2024-05-12 | Disposition: A | Discharge status: Home / Self Care | Source: Ambulatory Visit | Attending: Family Medicine | Admitting: Family Medicine

## 2024-05-12 ENCOUNTER — Ambulatory Visit

## 2024-05-12 DIAGNOSIS — Z1231 Encounter for screening mammogram for malignant neoplasm of breast: Secondary | ICD-10-CM
